# Patient Record
Sex: Male | Born: 1965 | Race: White | Hispanic: No | Marital: Married | Smoking: Former smoker
Health system: Southern US, Community
[De-identification: ages and names within clinical notes are randomized; demographics above are authoritative.]

## PROBLEM LIST (undated history)

## (undated) DIAGNOSIS — R001 Bradycardia, unspecified: Secondary | ICD-10-CM

## (undated) DIAGNOSIS — I2510 Atherosclerotic heart disease of native coronary artery without angina pectoris: Secondary | ICD-10-CM

## (undated) DIAGNOSIS — F32A Depression, unspecified: Secondary | ICD-10-CM

## (undated) DIAGNOSIS — G930 Cerebral cysts: Secondary | ICD-10-CM

## (undated) DIAGNOSIS — I252 Old myocardial infarction: Secondary | ICD-10-CM

## (undated) DIAGNOSIS — F329 Major depressive disorder, single episode, unspecified: Secondary | ICD-10-CM

## (undated) DIAGNOSIS — E663 Overweight: Secondary | ICD-10-CM

## (undated) DIAGNOSIS — E785 Hyperlipidemia, unspecified: Secondary | ICD-10-CM

## (undated) HISTORY — DX: Overweight: E66.3

## (undated) HISTORY — DX: Major depressive disorder, single episode, unspecified: F32.9

## (undated) HISTORY — PX: HERNIA REPAIR: SHX51

## (undated) HISTORY — DX: Hyperlipidemia, unspecified: E78.5

## (undated) HISTORY — PX: APPENDECTOMY: SHX54

## (undated) HISTORY — DX: Old myocardial infarction: I25.2

## (undated) HISTORY — DX: Atherosclerotic heart disease of native coronary artery without angina pectoris: I25.10

## (undated) HISTORY — DX: Bradycardia, unspecified: R00.1

## (undated) HISTORY — DX: Depression, unspecified: F32.A

## (undated) HISTORY — DX: Cerebral cysts: G93.0

---

## 2007-10-31 ENCOUNTER — Emergency Department (HOSPITAL_COMMUNITY): Admission: EM | Admit: 2007-10-31 | Discharge: 2007-11-01 | Payer: Self-pay | Admitting: Emergency Medicine

## 2009-01-17 ENCOUNTER — Emergency Department (HOSPITAL_COMMUNITY): Admission: EM | Admit: 2009-01-17 | Discharge: 2009-01-17 | Payer: Self-pay | Admitting: Emergency Medicine

## 2009-01-17 IMAGING — CR DG CHEST 2V
2 series · 2 of 2 positions shown · non-contrast
Comparison: None

CLINICAL DATA: High blood pressure, blurred vision, slight chest
pain

CHEST - 2 VIEW

[w chest pa]
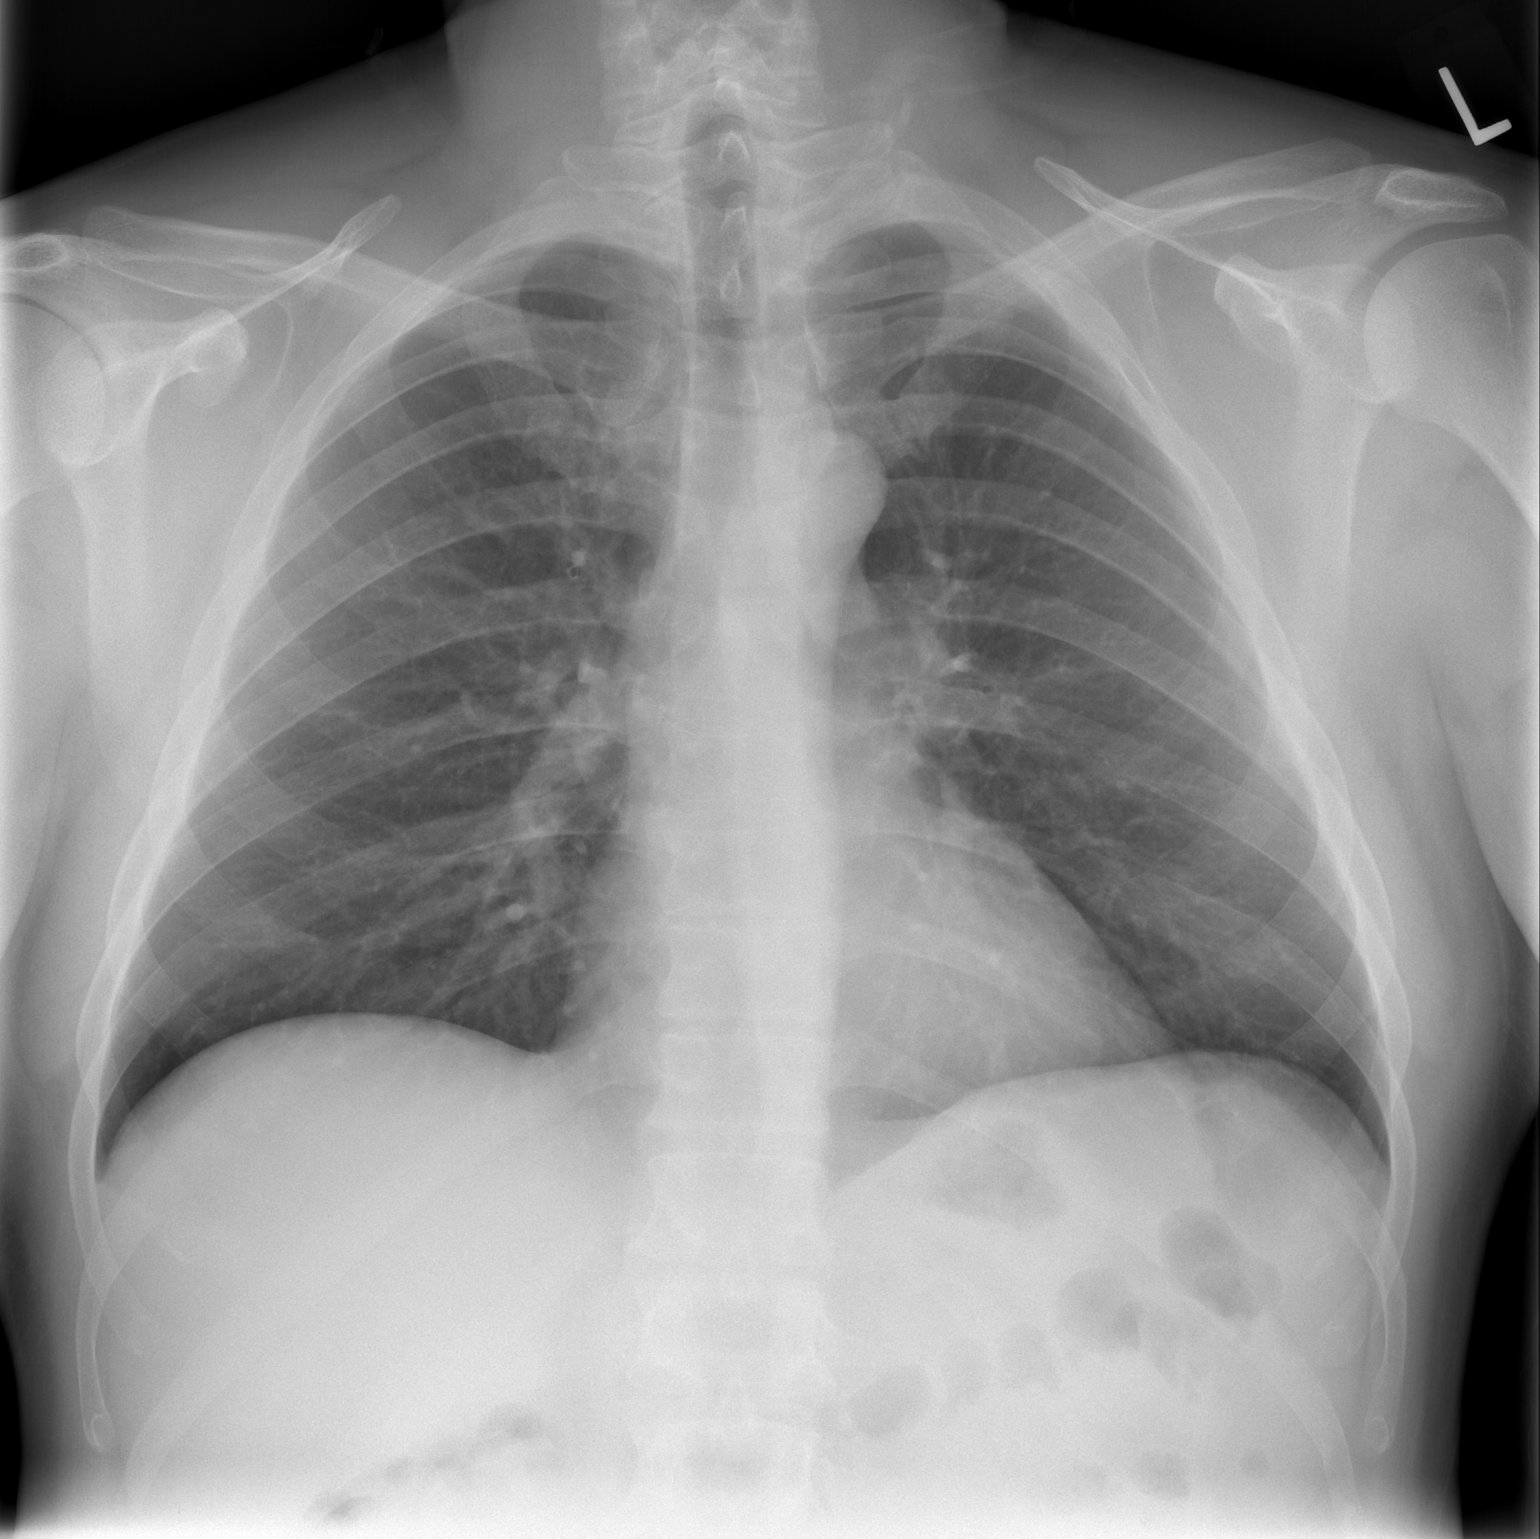

[w chest lat]
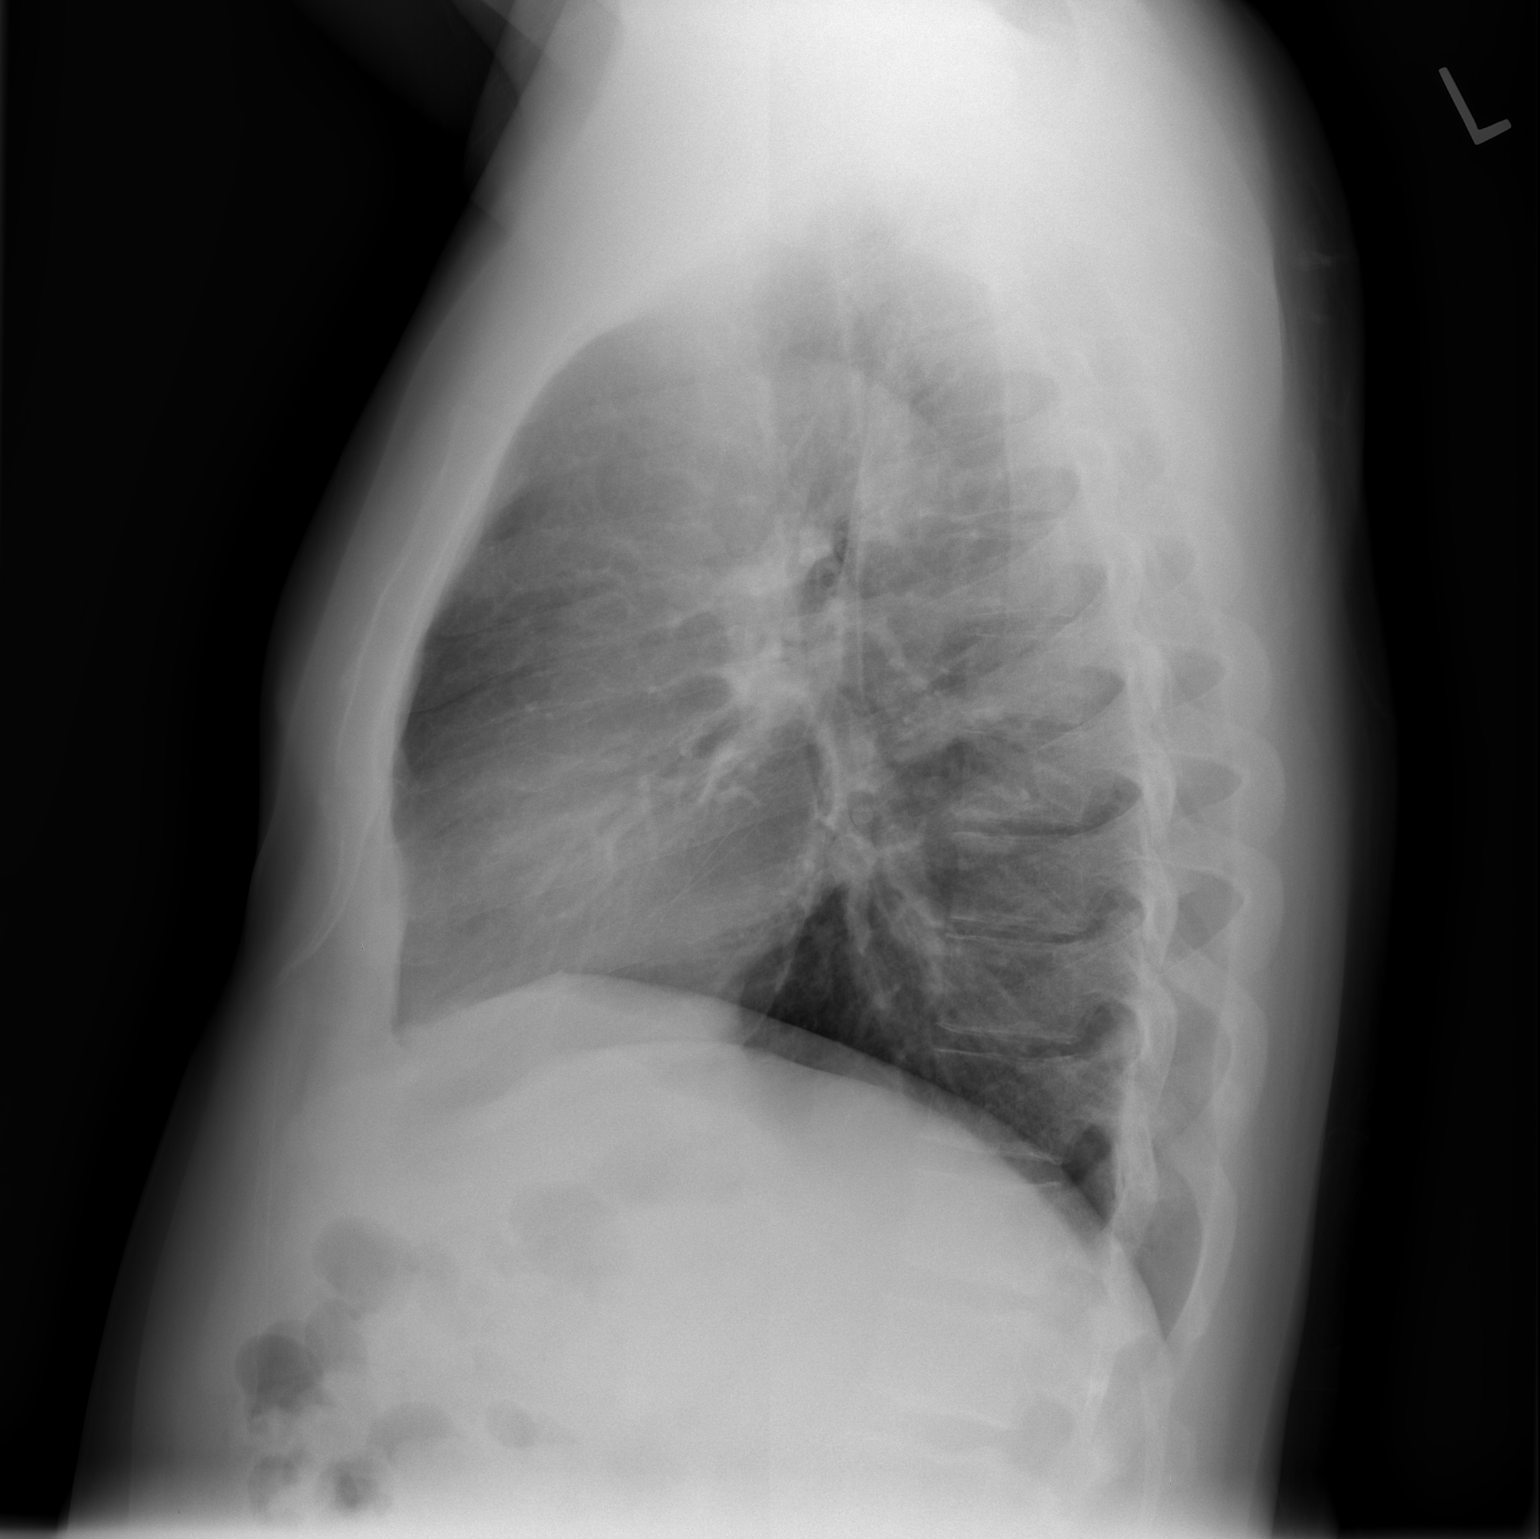

[2 of 2 positions shown; findings below may reference images not displayed]

FINDINGS: The lungs are clear.  The heart is within normal limits
in size.  No bony abnormality is seen.
IMPRESSION: No active lung disease.

## 2010-09-09 LAB — URINALYSIS, ROUTINE W REFLEX MICROSCOPIC
Glucose, UA: NEGATIVE mg/dL
Ketones, ur: NEGATIVE mg/dL
Nitrite: NEGATIVE
Protein, ur: NEGATIVE mg/dL
Urobilinogen, UA: 0.2 mg/dL (ref 0.0–1.0)

## 2010-09-09 LAB — BASIC METABOLIC PANEL
Chloride: 105 mEq/L (ref 96–112)
Glucose, Bld: 94 mg/dL (ref 70–99)
Potassium: 4.1 mEq/L (ref 3.5–5.1)
Sodium: 140 mEq/L (ref 135–145)

## 2010-09-09 LAB — CBC
HCT: 44.7 % (ref 39.0–52.0)
MCV: 91.2 fL (ref 78.0–100.0)
Platelets: 275 10*3/uL (ref 150–400)
RBC: 4.9 MIL/uL (ref 4.22–5.81)
WBC: 8.2 10*3/uL (ref 4.0–10.5)

## 2010-09-09 LAB — DIFFERENTIAL
Eosinophils Absolute: 0.1 10*3/uL (ref 0.0–0.7)
Eosinophils Relative: 1 % (ref 0–5)
Lymphs Abs: 1.9 10*3/uL (ref 0.7–4.0)
Monocytes Relative: 7 % (ref 3–12)

## 2010-09-09 LAB — RAPID URINE DRUG SCREEN, HOSP PERFORMED: Benzodiazepines: NOT DETECTED

## 2012-12-03 DIAGNOSIS — E559 Vitamin D deficiency, unspecified: Secondary | ICD-10-CM | POA: Insufficient documentation

## 2012-12-03 DIAGNOSIS — G930 Cerebral cysts: Secondary | ICD-10-CM | POA: Insufficient documentation

## 2014-06-14 DIAGNOSIS — I213 ST elevation (STEMI) myocardial infarction of unspecified site: Secondary | ICD-10-CM | POA: Insufficient documentation

## 2014-12-02 ENCOUNTER — Ambulatory Visit (INDEPENDENT_AMBULATORY_CARE_PROVIDER_SITE_OTHER): Payer: BLUE CROSS/BLUE SHIELD | Admitting: Orthopedic Surgery

## 2014-12-02 ENCOUNTER — Encounter: Payer: Self-pay | Admitting: Orthopedic Surgery

## 2014-12-02 DIAGNOSIS — M19041 Primary osteoarthritis, right hand: Secondary | ICD-10-CM | POA: Diagnosis not present

## 2014-12-02 MED ORDER — DICLOFENAC POTASSIUM 50 MG PO TABS: 50.0000 mg | ORAL_TABLET | Freq: Two times a day (BID) | ORAL | Status: DC

## 2014-12-02 NOTE — Progress Notes (Addendum)
New patient  Chief complaint bilateral hand pain bilateral knee pain bilateral hip pain  The patient to be evaluated for his right hand today paragraph he has x-rays already taken  He complains of severe pain in his right hand and wrist and severe pain the wrist is jammed in any way. He has difficulties at times with some activities he is employed as a Freight forwarderbody shop mechanic. He says the pain is severe as a dull ache   Review of Systems - Negative except sinus problems, anxiety seaonal allergy stiff joints   Exam Vital signs are stable. He is awake alert and oriented 3 Mood and affect are normal Gait and station are unaffected Appearance is normal well-groomed.  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 He has a history of rheumatoid arthritis but has not been worked up  He has not been splinted is not taking anti-inflammatory medications   At this point I would put him in his thumb splint a Rhino splint and then put him on diclofenac 50 mg twice a day #60 I'm aware that he is on Plavix  I think he needs his rheumatoid studies done his primary care doctor can do that   I am not a hand surgeon so if he doesn't respond to splinting and anti-inflammatory so we'll send him to a hand specialist  He will come back in 6 weeks to evaluate his right knee with an x-ray and an evaluation and examination at that time if he is not better from his wrist we can go ahead and make the referral for his hand

## 2014-12-04 ENCOUNTER — Encounter: Payer: Self-pay | Admitting: Orthopedic Surgery

## 2014-12-04 NOTE — Addendum Note (Signed)
Addended by: Vickki HearingHARRISON, STANLEY E on: 12/04/2014 10:25 AM   Modules accepted: Medications

## 2014-12-20 ENCOUNTER — Encounter: Payer: Self-pay | Admitting: Orthopedic Surgery

## 2014-12-20 ENCOUNTER — Ambulatory Visit: Payer: BLUE CROSS/BLUE SHIELD | Admitting: Orthopedic Surgery

## 2015-07-06 DIAGNOSIS — G5622 Lesion of ulnar nerve, left upper limb: Secondary | ICD-10-CM | POA: Insufficient documentation

## 2015-07-06 DIAGNOSIS — G5603 Carpal tunnel syndrome, bilateral upper limbs: Secondary | ICD-10-CM | POA: Insufficient documentation

## 2015-08-18 ENCOUNTER — Ambulatory Visit: Payer: BLUE CROSS/BLUE SHIELD | Admitting: Cardiology

## 2015-09-16 ENCOUNTER — Encounter: Payer: Self-pay | Admitting: Cardiology

## 2015-09-16 ENCOUNTER — Other Ambulatory Visit: Payer: Self-pay | Admitting: *Deleted

## 2015-09-16 ENCOUNTER — Ambulatory Visit (INDEPENDENT_AMBULATORY_CARE_PROVIDER_SITE_OTHER): Payer: BLUE CROSS/BLUE SHIELD | Admitting: Cardiology

## 2015-09-16 VITALS — BP 124/80 | HR 76 | Ht 74.0 in | Wt 227.6 lb

## 2015-09-16 DIAGNOSIS — Z136 Encounter for screening for cardiovascular disorders: Secondary | ICD-10-CM

## 2015-09-16 DIAGNOSIS — I25119 Atherosclerotic heart disease of native coronary artery with unspecified angina pectoris: Secondary | ICD-10-CM | POA: Diagnosis not present

## 2015-09-16 DIAGNOSIS — Z8659 Personal history of other mental and behavioral disorders: Secondary | ICD-10-CM

## 2015-09-16 DIAGNOSIS — R030 Elevated blood-pressure reading, without diagnosis of hypertension: Secondary | ICD-10-CM

## 2015-09-16 DIAGNOSIS — E785 Hyperlipidemia, unspecified: Secondary | ICD-10-CM

## 2015-09-16 DIAGNOSIS — IMO0001 Reserved for inherently not codable concepts without codable children: Secondary | ICD-10-CM

## 2015-09-16 MED ORDER — NITROGLYCERIN 0.4 MG SL SUBL: 0.4000 mg | SUBLINGUAL_TABLET | SUBLINGUAL | Status: DC | PRN

## 2015-09-16 NOTE — Patient Instructions (Signed)
Your physician wants you to follow-up in: 6 months with Dr. McDowell You will receive a reminder letter in the mail two months in advance. If you don't receive a letter, please call our office to schedule the follow-up appointment.  Your physician recommends that you continue on your current medications as directed. Please refer to the Current Medication list given to you today.  Thank you for choosing [facility]!!    

## 2015-09-16 NOTE — Progress Notes (Signed)
Cardiology Office Note  Date: 09/16/2015   ID: Travis Tapia, DOB 1965/07/26, MRN [id number]  PCP: Allie Dimmer, MD  Referring provider: Dr. Santo Held Consulting Cardiologist: Rozann Lesches, MD   Chief Complaint  Patient presents with  . Coronary Artery Disease    History of Present Illness: Travis Tapia is a 50 y.o. male referred for cardiology consultation by Dr. Barrie Dunker. I reviewed extensive records and updated his chart. He has been followed by Dr. Briant Sites with [facility]. History includes inferoposterior STEMI in January 2016 treated with DES to the RCA. He underwent staged DES intervention to the LAD at that time as well. LVEF was 55-60% by echocardiography.  He is here today with his wife. He states that due to change in job, now working in [place], he wanted to establish with a cardiologist that was closer by. We discussed his recent history. He states that earlier in the year he was having trouble with elevations in blood pressure, had been off of Toprol-XL, but after ER visit was placed on labetalol. He ran out of that medication and decided to go back on Toprol-XL. Today's blood pressure is normal. He states that during the time his blood pressure was up he was having some recurrent chest pain, but this has resolved. He also reports being on an antidepressant temporarily, admits that he still gets frustrated and irritated easily. He follows with his PCP for this.  Records indicate follow-up with Dr. Briant Sites in March of this year. At that time he was experiencing chest discomfort that was felt to be potentially GI in etiology. He is on Zantac.  I reviewed his ECG today which shows normal sinus rhythm. Medications also reviewed. He remains on aspirin and Plavix.  Past Medical History  Diagnosis Date  . Depression   . CAD (coronary artery disease)     DES to RCA 06/2014, DES to LAD 06/2014  . History of inferior wall myocardial infarction    January 2016  . Arachnoid cyst     Cerebellum - followed at [facility]    Past Surgical History  Procedure Laterality Date  . Appendectomy    . Hernia repair      Current Outpatient Prescriptions  Medication Sig Dispense Refill  . aspirin 81 MG tablet Take 81 mg by mouth daily.    Marland Kitchen atorvastatin (LIPITOR) 80 MG tablet Take 80 mg by mouth at bedtime.    . Cholecalciferol ([employer] D3) 2000 UNITS CAPS Take 1 capsule by mouth daily.     . clopidogrel (PLAVIX) 75 MG tablet Take 75 mg by mouth daily.    . metoprolol succinate (TOPROL-XL) 25 MG 24 hr tablet Take 25 mg by mouth daily.    . ranitidine (ZANTAC) 150 MG tablet Take 150 mg by mouth 2 (two) times daily.     No current facility-administered medications for this visit.   Allergies:  Review of patient's allergies indicates no known allergies.   Social History: The patient  reports that he quit smoking about 9 years ago. He does not have any smokeless tobacco history on file. He reports that he drinks about 1.2 oz of alcohol per week. He reports that he does not use illicit drugs.   Family History: The patient's family history includes Heart disease in his father; Hyperlipidemia in his father; Hypertension in his mother; Stroke in his father.   ROS:  Please see the history of present illness. Otherwise, complete review of systems is positive for stress  at work, emotional upset and frustration intermittently.  All other systems are reviewed and negative.   Physical Exam: VS:  BP 124/80 mmHg  Pulse 76  Ht 6' 2"  (1.88 m)  Wt 227 lb 9.6 oz (103.239 kg)  BMI 29.21 kg/m2  SpO2 96%, BMI Body mass index is 29.21 kg/(m^2).  Wt Readings from Last 3 Encounters:  09/16/15 227 lb 9.6 oz (103.239 kg)    General: Overweight male, appears comfortable at rest. HEENT: Conjunctiva and lids normal, oropharynx clear. Neck: Supple, no elevated JVP or carotid bruits, no thyromegaly. Lungs: Clear to auscultation, nonlabored breathing at rest. Cardiac:  Regular rate and rhythm, no S3 or significant systolic murmur, no pericardial rub. Abdomen: Soft, nontender, no hepatomegaly, bowel sounds present, no guarding or rebound. Extremities: No pitting edema, distal pulses 2+. Skin: Warm and dry. Musculoskeletal: No kyphosis. Neuropsychiatric: Alert and oriented x3, affect grossly appropriate.  ECG: I personally reviewed the prior tracing from 07/23/2014 which showed normal sinus rhythm with nonspecific T-wave changes.  Recent Labwork:  November 2016: Potassium 4.1, BUN 16, creatinine 0.9, hemoglobin 15.1, platelets 268, cholesterol 105, triglycerides 111, HDL 32, LDL 50, TSH 2.8  Other Studies Reviewed Today:  Cardiac catheterization 06/14/2014: Left Ventriculography:  EF 50-55% with subtle inferior hypokinesis.  Coronary Arteriography: Left Main Normal caliber vessel with no disease.  LAD Normal caliber transapical vessel with small first and normal second diagonal branches. There is a 70-80% early mid LAD stenosis. TIMI 1-2 flow.  Left Circumflex Normal caliber non-dominant vessel with one large multi-branching obtuse marginal. There is a 30% proximal LCx stenosis.  RCA Normal caliber dominant system with normal PDA and posterolateral branches. The proximal RCA was 100% occluded. TIMI 0 flow. This is the acute infarct vessel.   Coronary Intervention:  RCA: The pre-procedural stenosis was 100% and there was TIMI 0 flow which is the acute infarct vessel. A JR5 guide was used to engage the RCA. A Prowater wire was advanced into the distal RCA. A 2.5/12balloon was used to pre-dilate and a 2.75/15 Xience drug eluting stent was placed. Post-dilatation was done with a 3.25 [place]o balloon.   An IVUS catheter was then used to image the treated segment of the vessel and this revealed complete stent expansion, no malapposition or edge dissection, reference vessel diameters consistent the the stent and post-dilation balloon diameters. The minimum cross  sectional area following stent deployment was >47m2.  The final result was 0% and TIMI 3 flow.  IMPRESSION:  1. Successful one-vessel PCI with DES for acute MI 2. Findings discussed with patient PLAN:  1. Routine post-cath care 2. [facility]  RAnnie Main MD 06/16/2014 10:24 PM   Cardiac catheterization 06/15/2014: Coronary Intervention:  LAD: The pre-procedural stenosis was 80% and there was TIMI 3 flow. AL2 guide was used to engage the LM. A Prowater wire was advanced into the distal LAD. A 2.5/12 balloon was used to pre-dilate and a 3.0/15 Xience drug eluting stent was placed. Post-dilatation was not required.   An IVUS catheter was then used to image the treated segment of the vessel and this revealed complete stent expansion, no malapposition or edge dissection, reference vessel diameters consistent the the stent and post-dilation balloon diameters. The minimum cross sectional area following stent deployment was >478m.  The final result was 0% and TIMI 3 flow.  Details-The 37f88fu 3.5 Launcher guide was inserted. The [employer]0 guidewire was inserted. LCX The  Pt 2 Ms 185cm .014 guidewire was inserted. Into LAD The Stent Xience Alpine 3.0 X 15 stent delivery system was inserted over an interventional guidewire with stent intact. The Stent Xience Alpine 3.0 X 15 stent delivery system was removed with the stent still intact and undeployed. The Resolute 2.75x14 stent delivery system was inserted over an interventional guidewire with stent intact. The Resolute 2.75x14 stent delivery system was removed with the stent still intact and undeployed. The [employer]180 guidewire was inserted. The Emerge Rx Balloon 2.5 X 12 ([id number]) balloon was inserted.Over grandslam wire A Emerge Rx Balloon 2.5 X 12 ([id number]) was used to10 atm for 20 sec The Emerge Rx Balloon 2.5 X 12 ([id number]) balloon was removed over the  guidewire. The 27fGuideliner guide was inserted. The Stent Xience Alpine 3.0 X 15 stent delivery system was inserted over an interventional guidewire with stent intact. The Stent Xience Alpine 3.0 X 15 stent delivery system was removed with the stent still intact and undeployed. The 520fuideliner was removed. The 78f71fu 3.5 Launcher was removed. The 53f 41f1.0 Launcher guide was inserted. The current guiding catheter was exchanged for the 53fr 24flatz Left Al2 guiding catheter. The Pt 2 Ms 185cm .014 guidewire was inserted. LCX The Asahi Prowater 180 guidewire was inserted. LAD The 53fr G80feliner V2 guide was inserted. The Emerge Rx Balloon 2.5 X 12 ([id number] was inserted. A Emerge Rx Balloon 2.5 X 12 ([id number] to12 atm for 20 sec The Emerge Rx Balloon 2.5 X 12 ([id number] was removed over the guidewire. The Stent Xience Alpine 3.0 X 15 stent delivery system was inserted over an interventional guidewire with stent intact. A Stent Xience Alpine 3.0 X 15 was used to12 atm for 30 sec The Stent Xience Alpine 3.0 X 15 stent delivery system was removed. The Ivus Catheter [employer] Platinum IVUS catheter was inserted over the interventional guidewire. IVUS catheter position verified under flouro. Recording IVUS images. IVUS catheter removed over guidewire. The 53fr Gu58fliner V2 was removed. The 53fr Amp57fz Left Al2 was removed.   IMPRESSION:  1. Successful one-vessel PCI with DES 2. Findings discussed with patient PLAN:  1. Routine post-cath care 2. [facility]  Travis Daniel Nones1/05/2015 12:01 PM   Echocardiogram 06/15/2014: Summary 1. Overall left ventricular ejection fraction is estimated at 55 to 60%. 2. (Grade 1) Mildly abnormal left ventricular diastolic filling. 3. Mild concentric left ventricular hypertrophy.  Left Ventricle: Overall left ventricular ejection fraction is estimated at 55 to 60%. The left  ventricular internal cavity size was normal. LV septal wall thickness was moderately increased. LV posterior wall thickness is mildly increased. Mild concentric left ventricular hypertrophy. Spectral Doppler shows Grade 1 (Mild) pattern of LV diastolic filling. Tissue Doppler indicates normal left ventricular end diastolic pressure..  Right Ventricle: Normal right ventricular size, wall thickness, and systolic function.  Left Atrium: The left atrium is normal in size.  Right Atrium: The right atrium is normal in size. The inferior vena cava measures 1.40 cm.  Pericardium: There is no evidence of pericardial effusion.  Mitral Valve: Structurally normal mitral valve, with normal leaflet excursion; without any evidence of mitral stenosis. Trace mitral valve regurgitation is seen.  Tricuspid Valve: The tricuspid valve is normal in structure. Trace tricuspid regurgitation is visualized.  Aortic Valve: The aortic valve is trileaflet and structurally normal, with normal leaflet excursion; without any evidence of aortic stenosis. The  peak pressure gradient across the aortic valve is 4.2 mmHg, and the mean pressure gradient is 3.0 mmHg. The calculated AVA is 3.18 cm. No evidence of aortic valve regurgitation is seen.  Pulmonic Valve: The pulmonic valve is normal. No indication of pulmonic valve regurgitation.  Aorta: The aortic root and ascending aorta appear structurally normal, with no evidence of dilitation.  Pulmonary Artery: The main pulmonary artery appears normal in size, origin and position.  Shunts: No evidence of intracardiac shunt.  2D AND M-MODE MEASUREMENTS (normal ranges within parentheses) Left Ventricle: IVSd-2D (0.7-1.1cm): 1.32 cm LVPWd-2D (0.7-1.1cm): 1.21 cm LVIDd-2D (3.4-5.7cm): 4.62 cm LVIDs-2D (1.9-3.7cm): 3.64 cm LV FS-2D (>25%): 21.2 %  Aorta/Left Atrium: Aortic Root-2D (2.4-3.7cm): 3.6 cm Left Atrium-2D (1.9-4.0cm): 3.7 cm LVOT: 2.3 cm LA Volume  (MOD) LA Vol A4C MOD 37.6 ml LA Vol A2C MOD 46.9 ml LA Vol BP MOD 41.7 ml TAPSE 6.06 cm  LV DIASTOLIC FUNCTION: MV Peak E: 0.77 m/s MV Peak A: 0.80 m/s E/A Ratio: 0.95 LV IVRT: 85 msec septal e': 7.51 m/s E/e' Ratio: 0.10  Mitral Valve: MV Max Vel: 0.83 m/s MV Mean Grad: 1.0 mmHg MV P1/2 Time: 32.19 msec MV Area, PHT: 6.83 cm  Aortic Valve: AoV Max Vel: 1.02 m/s AoV Peak PG: 4.2 mmHg AoV Mean PG: 3.0 mmHg  LVOT Vmax: 0.84 m/s LVOT VTI: 0.157 m LVOT Diameter: 2.30 cm  AoV Area, Vmax: 3.40 cm AoV Area, VTI: 3.18 cm AoV Area, Vmn: 3.22 cm AoV Area Index (VTI): 1.46  Tricuspid Valve and PA/RV Systolic Pressure: TR Max Velocity: 2.42 m/s RA Pressure: RVSP/PASP:  Pulmonic Valve: PV Max Velocity: 0.56 m/s PV Max PG: 1.2 mmHg PV Mean PG:  Electronically signed by Tillie Fantasia II, Signature Date/Time: 06/15/2014/8:36:07 PM  Assessment and Plan:  1. CAD status post inferoposterior STEMI in January 2016 treated with DES to the RCA followed by staged DES intervention to the LAD. LVEF normal range at that time. At present he does not report any angina symptoms and his ECG is normal. I reviewed his medications, he would like to stay on DAPT for now. I have recommended a walking regimen for exercise and more attention to stress reduction. He will stay on Toprol-XL for now, although lead me know if he experiences any fatigue or other symptoms. We will see him back in 6 months, sooner if needed. Refill provided for sublingual nitroglycerin.  2. Elevated blood pressure earlier in the year, no standing history of hypertension. Continue observation for now. Consider ARB if additional therapy as needed.  3. Continues on statin therapy with LDL 50 and November 2016. Does have mildly low HDL.  4. Symptoms of frustration, intermittent emotional upset. Atypical depression could certainly be an issue. I recommended that he continue to discuss the symptoms with Dr.  Jenean Lindau.  Current medicines were reviewed with the patient today.   Orders Placed This Encounter  Procedures  . EKG 12-Lead    Disposition: FU with me in 6 months.   Signed, Satira Sark, MD, [facility] 09/16/2015 2:35 PM    [facility] [facility] [place], [place], [place] [address] Phone: [phone]; Fax: [phone]

## 2015-12-28 ENCOUNTER — Telehealth: Payer: Self-pay | Admitting: Cardiology

## 2015-12-28 NOTE — Telephone Encounter (Signed)
Travis Tapia walked into office wanting to check with Dr. Diona Browner about taking Garcinia Cambogia for weight loss and also start taking Apple Cider Vinegar.  Please advise and call home #.

## 2015-12-28 NOTE — Telephone Encounter (Signed)
These would not be treatments that I would generally recommend to my patients as the data are fairly inconsistent about whether there is a definitive benefit and also because we do not fully understand what other potential interactions they may have with other prescribed medications. He should do his research carefully when deciding about using these types of supplements.

## 2015-12-29 NOTE — Telephone Encounter (Signed)
Patient informed. 

## 2016-01-20 ENCOUNTER — Emergency Department (HOSPITAL_COMMUNITY): Payer: BLUE CROSS/BLUE SHIELD

## 2016-01-20 ENCOUNTER — Observation Stay (HOSPITAL_COMMUNITY)
Admission: EM | Admit: 2016-01-20 | Discharge: 2016-01-21 | Disposition: A | Discharge status: Home / Self Care | Payer: BLUE CROSS/BLUE SHIELD | Attending: Internal Medicine | Admitting: Internal Medicine

## 2016-01-20 ENCOUNTER — Encounter (HOSPITAL_COMMUNITY): Payer: Self-pay | Admitting: Emergency Medicine

## 2016-01-20 DIAGNOSIS — R739 Hyperglycemia, unspecified: Secondary | ICD-10-CM | POA: Diagnosis not present

## 2016-01-20 DIAGNOSIS — R0789 Other chest pain: Principal | ICD-10-CM | POA: Insufficient documentation

## 2016-01-20 DIAGNOSIS — E876 Hypokalemia: Secondary | ICD-10-CM | POA: Insufficient documentation

## 2016-01-20 DIAGNOSIS — I2510 Atherosclerotic heart disease of native coronary artery without angina pectoris: Secondary | ICD-10-CM | POA: Diagnosis not present

## 2016-01-20 DIAGNOSIS — R079 Chest pain, unspecified: Secondary | ICD-10-CM | POA: Diagnosis present

## 2016-01-20 DIAGNOSIS — I252 Old myocardial infarction: Secondary | ICD-10-CM | POA: Diagnosis not present

## 2016-01-20 DIAGNOSIS — Z79899 Other long term (current) drug therapy: Secondary | ICD-10-CM | POA: Diagnosis not present

## 2016-01-20 DIAGNOSIS — E785 Hyperlipidemia, unspecified: Secondary | ICD-10-CM

## 2016-01-20 DIAGNOSIS — Z955 Presence of coronary angioplasty implant and graft: Secondary | ICD-10-CM | POA: Insufficient documentation

## 2016-01-20 DIAGNOSIS — Z7902 Long term (current) use of antithrombotics/antiplatelets: Secondary | ICD-10-CM | POA: Insufficient documentation

## 2016-01-20 DIAGNOSIS — Z720 Tobacco use: Secondary | ICD-10-CM | POA: Insufficient documentation

## 2016-01-20 DIAGNOSIS — I2583 Coronary atherosclerosis due to lipid rich plaque: Secondary | ICD-10-CM

## 2016-01-20 LAB — CBC
HCT: 42.2 % (ref 39.0–52.0)
Hemoglobin: 14.7 g/dL (ref 13.0–17.0)
MCH: 30.9 pg (ref 26.0–34.0)
MCHC: 34.8 g/dL (ref 30.0–36.0)
MCV: 88.7 fL (ref 78.0–100.0)
PLATELETS: 265 10*3/uL (ref 150–400)
RBC: 4.76 MIL/uL (ref 4.22–5.81)
RDW: 12.3 % (ref 11.5–15.5)
WBC: 6.7 10*3/uL (ref 4.0–10.5)

## 2016-01-20 LAB — BASIC METABOLIC PANEL
Anion gap: 8 (ref 5–15)
BUN: 12 mg/dL (ref 6–20)
CALCIUM: 9 mg/dL (ref 8.9–10.3)
CO2: 23 mmol/L (ref 22–32)
CREATININE: 0.9 mg/dL (ref 0.61–1.24)
Chloride: 107 mmol/L (ref 101–111)
GFR calc non Af Amer: >60 mL/min (ref >60)
Glucose, Bld: 139 mg/dL — ABNORMAL HIGH (ref 65–99)
Potassium: 3.4 mmol/L — ABNORMAL LOW (ref 3.5–5.1)
SODIUM: 138 mmol/L (ref 135–145)

## 2016-01-20 LAB — TROPONIN I

## 2016-01-20 LAB — I-STAT TROPONIN, ED: TROPONIN I, POC: 0 ng/mL (ref 0.00–0.08)

## 2016-01-20 LAB — MRSA PCR SCREENING: MRSA by PCR: NEGATIVE

## 2016-01-20 MED ORDER — SODIUM CHLORIDE 0.9% FLUSH
3.0000 mL | Freq: Two times a day (BID) | INTRAVENOUS | Status: DC
  Administered 2016-01-20 – 2016-01-21 (×2): 3 mL via INTRAVENOUS

## 2016-01-20 MED ORDER — NITROGLYCERIN 0.4 MG SL SUBL: 0.4000 mg | SUBLINGUAL_TABLET | SUBLINGUAL | Status: DC | PRN

## 2016-01-20 MED ORDER — CLOPIDOGREL BISULFATE 75 MG PO TABS
75.0000 mg | ORAL_TABLET | Freq: Every morning | ORAL | Status: DC
  Administered 2016-01-21: 75 mg via ORAL
  Filled 2016-01-20: qty 1

## 2016-01-20 MED ORDER — VITAMIN D3 25 MCG (1000 UNIT) PO TABS
2000.0000 [IU] | ORAL_TABLET | Freq: Every day | ORAL | Status: DC
  Administered 2016-01-20 – 2016-01-21 (×2): 2000 [IU] via ORAL
  Filled 2016-01-20 (×3): qty 2

## 2016-01-20 MED ORDER — ASPIRIN EC 81 MG PO TBEC
81.0000 mg | DELAYED_RELEASE_TABLET | Freq: Every day | ORAL | Status: DC
  Administered 2016-01-21: 81 mg via ORAL
  Filled 2016-01-20: qty 1

## 2016-01-20 MED ORDER — FAMOTIDINE 20 MG PO TABS
10.0000 mg | ORAL_TABLET | Freq: Two times a day (BID) | ORAL | Status: DC
Start: 2016-01-20 — End: 2016-01-21
  Administered 2016-01-20 – 2016-01-21 (×2): 10 mg via ORAL
  Filled 2016-01-20 (×2): qty 1

## 2016-01-20 MED ORDER — MORPHINE SULFATE (PF) 2 MG/ML IV SOLN: 1.0000 mg | INTRAVENOUS | Status: DC | PRN

## 2016-01-20 MED ORDER — ENOXAPARIN SODIUM 40 MG/0.4ML ~~LOC~~ SOLN
40.0000 mg | SUBCUTANEOUS | Status: DC
  Administered 2016-01-20: 40 mg via SUBCUTANEOUS
  Filled 2016-01-20: qty 0.4

## 2016-01-20 MED ORDER — ATORVASTATIN CALCIUM 80 MG PO TABS
80.0000 mg | ORAL_TABLET | Freq: Every day | ORAL | Status: DC
  Administered 2016-01-20: 80 mg via ORAL
  Filled 2016-01-20: qty 1

## 2016-01-20 MED ORDER — SODIUM CHLORIDE 0.9 % IV SOLN
INTRAVENOUS | Status: AC
  Administered 2016-01-20: 23:00:00 via INTRAVENOUS

## 2016-01-20 MED ORDER — POTASSIUM CHLORIDE CRYS ER 20 MEQ PO TBCR
20.0000 meq | EXTENDED_RELEASE_TABLET | Freq: Once | ORAL | Status: AC
  Administered 2016-01-20: 20 meq via ORAL
  Filled 2016-01-20: qty 1

## 2016-01-20 NOTE — ED Triage Notes (Addendum)
Pt reports was at work today when chest pain developed this morning. Pt reports history of same with x2 stent placement in 2016. Pt reports takes daily aspirin and is on plavix. Pt alert and oriented. Pt denies sob,nausea. Mild sweating noted at time of EKG. Pt reports generalized weakness and "coolness" to BLE for last several days.pt reports took x1 nitro PTA, pt reports minimal relief.

## 2016-01-20 NOTE — ED Notes (Signed)
Patient states his chest pain is 3/10. Patient states he does not want anything for pain, but will let nurse know if pain increases and wants something for pain.

## 2016-01-20 NOTE — ED Provider Notes (Signed)
[facility]-EM[facility]ENCY DEPT Provider Note   CSN: [id number] Arrival date & time: 01/20/16  1357     History   Chief Complaint Chief Complaint  Patient presents with  . Chest Pain    HPI Travis Tapia is a 50 y.o. male.  50 yo male with CAD/ stent on plavix and compliant presents with chest pain since this am.  Feels similar to MI hx in 2015. Mild neck radiation.  No vomiting or diaphoresis.  No recent exertional sxs.  No PE risk factors.  Mild ache currently.    The history is provided by the patient and medical records.  Chest Pain   Pertinent negatives include no abdominal pain, no back pain, no fever, no headaches, no shortness of breath and no vomiting.    Past Medical History:  Diagnosis Date  . Arachnoid cyst    Cerebellum - followed at [facility]  . CAD (coronary artery disease)    DES to RCA 06/2014, DES to LAD 06/2014  . Depression   . History of inferior wall myocardial infarction    January 2016    There are no active problems to display for this patient.   Past Surgical History:  Procedure Laterality Date  . APPENDECTOMY    . HERNIA REPAIR         Home Medications    Prior to Admission medications   Medication Sig Start Date End Date Taking? Authorizing Provider  aspirin 81 MG tablet Take 81 mg by mouth daily.    Historical Provider, MD  atorvastatin (LIPITOR) 80 MG tablet Take 80 mg by mouth at bedtime.    Historical Provider, MD  Cholecalciferol ([employer]S D3) 2000 UNITS CAPS Take 1 capsule by mouth daily.     Historical Provider, MD  clopidogrel (PLAVIX) 75 MG tablet Take 75 mg by mouth daily.    Historical Provider, MD  metoprolol succinate (TOPROL-XL) 25 MG 24 hr tablet Take 25 mg by mouth daily.    Historical Provider, MD  nitroGLYCERIN (NITROSTAT) 0.4 MG SL tablet Place 1 tablet (0.4 mg total) under the tongue every 5 (five) minutes as needed for chest pain. 09/16/15   Jonelle Sidle, MD  ranitidine (ZANTAC) 150 MG tablet Take 150 mg by mouth 2 (two) times  daily.    Historical Provider, MD    Family History Family History  Problem Relation Age of Onset  . Heart disease Father   . Stroke Father   . Hyperlipidemia Father   . Hypertension Mother     Social History Social History  Substance Use Topics  . Smoking status: Former Smoker    Quit date: 06/04/2006  . Smokeless tobacco: Current User    Types: Chew  . Alcohol use 1.2 oz/week    2 Cans of beer per week     Comment: beer twice a week.     Allergies   Review of patient's allergies indicates no known allergies.   Review of Systems Review of Systems  Constitutional: Negative for chills and fever.  HENT: Negative for congestion.   Eyes: Negative for visual disturbance.  Respiratory: Negative for shortness of breath.   Cardiovascular: Positive for chest pain.  Gastrointestinal: Negative for abdominal pain and vomiting.  Genitourinary: Negative for dysuria and flank pain.  Musculoskeletal: Negative for back pain, neck pain and neck stiffness.  Skin: Negative for rash.  Neurological: Negative for light-headedness and headaches.     Physical Exam Updated Vital Signs BP 124/87 (BP Location: Left Arm)   Pulse  63   Resp 14   Ht 5\' 8"  (1.727 m)   Wt 227 lb (103 kg)   SpO2 95%   BMI 34.52 kg/m   Physical Exam  Constitutional: He is oriented to person, place, and time. He appears well-developed and well-nourished.  HENT:  Head: Normocephalic and atraumatic.  Eyes: Conjunctivae are normal. Right eye exhibits no discharge. Left eye exhibits no discharge.  Neck: Normal range of motion. Neck supple. No tracheal deviation present.  Cardiovascular: Normal rate, regular rhythm and intact distal pulses.   Pulmonary/Chest: Effort normal and breath sounds normal.  Abdominal: Soft. He exhibits no distension. There is no tenderness. There is no guarding.  Musculoskeletal: He exhibits no edema.  Neurological: He is alert and oriented to person, place, and time.  Skin: Skin is  warm. No rash noted.  Psychiatric: He has a normal mood and affect.  Nursing note and vitals reviewed.    ED Treatments / Results  Labs (all labs ordered are listed, but only abnormal results are displayed) Labs Reviewed  BASIC METABOLIC PANEL - Abnormal; Notable for the following:       Result Value   Potassium 3.4 (*)    Glucose, Bld 139 (*)    All other components within normal limits  CBC  I-STAT TROPOININ, ED    EKG  EKG Interpretation  Date/Time:  Friday January 20 2016 14:05:00 EDT Ventricular Rate:  76 PR Interval:    QRS Duration: 103 QT Interval:  387 QTC Calculation: 436 R Axis:   70 Text Interpretation:  Sinus rhythm T wave inversion Confirmed by Jodi MourningZAVITZ MD, Kyriaki Moder [phone]([id number]) on 01/20/2016 2:49:48 PM       Radiology Dg Chest 2 View  Result Date: 01/20/2016 CLINICAL DATA:  Chest pain today.  Nausea.  Bilateral leg weakness. EXAM: CHEST  2 VIEW COMPARISON:  One-view chest x-ray 07/05/2015 FINDINGS: The heart size is normal. The lungs are clear. The visualized soft tissues and bony thorax are unremarkable. IMPRESSION: Negative two view chest. Electronically Signed   By: Marin Robertshristopher  Mattern M.D.   On: 01/20/2016 15:18    Procedures Procedures (including critical care time)  Medications Ordered in ED Medications - No data to display   Initial Impression / Assessment and Plan / ED Course  I have reviewed the triage vital signs and the nursing notes.  Pertinent labs & imaging results that were available during my care of the patient were reviewed by me and considered in my medical decision making (see chart for details).  Clinical Course   Pt with CAD hx presents with chest pain similar to previous MI.  Pain improved in ED, asa prior to arrival.   Troponin neg.   Plan for tele obs for stress test/ further eval.  Transfer to [facility] for stress test, [facility]. The patients results and plan were reviewed and discussed.   Any x-rays  performed were independently reviewed by myself.   Differential diagnosis were considered with the presenting HPI.  Medications - No data to display  Vitals:   01/20/16 1407 01/20/16 1408 01/20/16 1518  BP: 125/93  124/87  Pulse: 77  63  Resp: 20  14  SpO2: 96%  95%  Weight:  227 lb (103 kg)   Height:  5\' 8"  (1.727 m)     Final diagnoses:  Acute chest pain    Admission/ observation were discussed with the admitting physician, patient and/or family and they are comfortable with the plan.  Final Clinical Impressions(s) / ED Diagnoses   Final diagnoses:  Acute chest pain    New Prescriptions New Prescriptions   No medications on file     Blane OharaJoshua Rhaelyn Giron, MD 01/20/16 1547

## 2016-01-20 NOTE — ED Notes (Signed)
Patient refused Morphine. States he does not want anything for pain.

## 2016-01-20 NOTE — H&P (Signed)
[facility] H&P   Patient Demographics:    Travis AdesReuben Tapia, is a 50 y.o. male  MRN: [id number]   DOB - [birth year]  Admit Date - 01/20/2016  Outpatient Primary MD for the patient is Majel HomerBUCHANAN,LINDA, MD  Referring MD/NP/PA: Blane OharaJoshua Zavitz  Outpatient Specialists:  Dr. Diona BrownerMcDowell (cardiologist)  Patient coming from:  work  Chief Complaint  Patient presents with  . Chest Pain      HPI:    Travis Tapia  is a 50 y.o. male, w/ history of CAD s/p stent x2 c/o chest pain starting about 10:30 am this am. While at work.  "pressure" with radiation to the neck. Pt was walking when occurred. Pt denies fever, chills, palp, dyspnea, heartburn.  Pt tried some gingerale but this didn't help so presented to ED. Lasted until presented to ED.  Took slg nitro at 1:30pm which didn't help.  Pt states that pain similiary to his prior heart pain.  Pain level currently 1/10.    In Ed,  NSR @ 75, nl axis, poor R progression, no st-t changes c/w acute ischemia.  CXR =>negative.  [facility] no [facility] services over the weekend at [facility]     Review of systems:    In addition to the HPI above,  No Fever-chills, No Headache, No changes with Vision or hearing, No problems swallowing food or Liquids, No Chest pain, Cough or Shortness of Breath, No Abdominal pain, No Nausea or Vommitting, Bowel movements are regular, No Blood in stool or Urine, No dysuria, No new skin rashes or bruises, No new joints pains-aches,  No new weakness, tingling, numbness in any extremity, No recent weight gain or loss, No polyuria, polydypsia or polyphagia, No significant Mental Stressors.  A full 10 point Review of Systems was done, except as stated above, all other Review of Systems were negative.   With Past History of the following :    Past Medical History:  Diagnosis Date  .  Arachnoid cyst    [facility]  . CAD (coronary artery disease)    DES to RCA 06/2014, DES to LAD 06/2014  . Depression   . History of inferior wall myocardial infarction    January 2016      Past Surgical History:  Procedure Laterality Date  . APPENDECTOMY    . HERNIA REPAIR        Social History:     Social History  Substance Use Topics  . Smoking status: Former Smoker    Quit date: 06/04/2006  . Smokeless tobacco: Current User    Types: Chew  . Alcohol use 1.2 oz/week    2 Cans of beer per week     Comment: beer twice a week.     Lives -  At home  Mobility -      Family History :  Family History  Problem Relation Age of Onset  . Heart disease Father     CABG  . Stroke Father   . Hyperlipidemia Father   . Hypertension Mother       Home Medications:   Prior to Admission medications   Medication Sig Start Date End Date Taking? Authorizing Provider  aspirin 81 MG tablet Take 81 mg by mouth daily.   Yes Historical Provider, MD  atorvastatin (LIPITOR) 80 MG tablet Take 80 mg by mouth at bedtime.   Yes Historical Provider, MD  Cholecalciferol ([employer]S D3) 2000 UNITS CAPS Take 1 capsule by mouth daily.    Yes Historical Provider, MD  clopidogrel (PLAVIX) 75 MG tablet Take 75 mg by mouth every morning.    Yes Historical Provider, MD  nitroGLYCERIN (NITROSTAT) 0.4 MG SL tablet Place 1 tablet (0.4 mg total) under the tongue every 5 (five) minutes as needed for chest pain. 09/16/15  Yes Jonelle SidleSamuel G McDowell, MD  ranitidine (ZANTAC) 150 MG tablet Take 150 mg by mouth 2 (two) times daily.   Yes Historical Provider, MD  SALINE NASAL MIST NA Place 1-2 sprays into the nose daily as needed (for congestion).   Yes Historical Provider, MD     Allergies:    No Known Allergies   Physical Exam:   Vitals  Blood pressure 124/96, pulse 81, resp. rate 18, height 5\' 8"  (1.727 m), weight 103 kg (227 lb), SpO2 97 %.   1. General  lying in bed in NAD,    2.  Normal affect and insight, Not Suicidal or Homicidal, Awake Alert, Oriented X 3.  3. No F.N deficits, ALL C.Nerves Intact, Strength 5/5 all 4 extremities, Sensation intact all 4 extremities, Plantars down going.  4. Ears and Eyes appear Normal, Conjunctivae clear, PERRLA. Moist Oral Mucosa.  5. Supple Neck, No JVD, No cervical lymphadenopathy appriciated, No Carotid Bruits.  6. Symmetrical Chest wall movement, Good air movement bilaterally, CTAB.  7. RRR, No Gallops, Rubs or Murmurs, No Parasternal Heave.  8. Positive Bowel Sounds, Abdomen Soft, No tenderness, No organomegaly appriciated,No rebound -guarding or rigidity.  9.  No Cyanosis, Normal Skin Turgor, No Skin Rash or Bruise.  10. Good muscle tone,  joints appear normal , no effusions, Normal ROM.  11. No Palpable Lymph Nodes in Neck or Axillae     Data Review:    CBC  Recent Labs Lab 01/20/16 1355  WBC 6.7  HGB 14.7  HCT 42.2  PLT 265  MCV 88.7  MCH 30.9  MCHC 34.8  RDW 12.3   ------------------------------------------------------------------------------------------------------------------  Chemistries   Recent Labs Lab 01/20/16 1355  NA 138  K 3.4*  CL 107  CO2 23  GLUCOSE 139*  BUN 12  CREATININE 0.90  CALCIUM 9.0   ------------------------------------------------------------------------------------------------------------------ estimated creatinine clearance is 116.7 mL/min (by C-G formula based on SCr of 0.9 mg/dL). ------------------------------------------------------------------------------------------------------------------ No results for input(s): TSH, T4TOTAL, T3FREE, THYROIDAB in the last 72 hours.  Invalid input(s): FREET3  Coagulation profile No results for input(s): INR, PROTIME in the last 168 hours. ------------------------------------------------------------------------------------------------------------------- No results for input(s): DDIMER in the last 72  hours. -------------------------------------------------------------------------------------------------------------------  Cardiac Enzymes No results for input(s): CKMB, TROPONINI, MYOGLOBIN in the last 168 hours.  Invalid input(s): CK ------------------------------------------------------------------------------------------------------------------ No results found for: BNP   ---------------------------------------------------------------------------------------------------------------  Urinalysis    Component Value Date/Time   COLORURINE YELLOW 01/17/2009 1752   APPEARANCEUR CLOUDY (A) 01/17/2009 1752   LABSPEC 1.014 01/17/2009 1752   PHURINE 7.0 01/17/2009 1752  GLUCOSEU NEGATIVE 01/17/2009 1752   HGBUR NEGATIVE 01/17/2009 1752   BILIRUBINUR NEGATIVE 01/17/2009 1752   KETONESUR NEGATIVE 01/17/2009 1752   PROTEINUR NEGATIVE 01/17/2009 1752   UROBILINOGEN 0.2 01/17/2009 1752   NITRITE NEGATIVE 01/17/2009 1752   LEUKOCYTESUR  01/17/2009 1752    NEGATIVE MICROSCOPIC NOT DONE ON URINES WITH NEGATIVE PROTEIN, BLOOD, LEUKOCYTES, NITRITE, OR GLUCOSE <1000 mg/dL.    ----------------------------------------------------------------------------------------------------------------   Imaging Results:    Dg Chest 2 View  Result Date: 01/20/2016 CLINICAL DATA:  Chest pain today.  Nausea.  Bilateral leg weakness. EXAM: CHEST  2 VIEW COMPARISON:  One-view chest x-ray 07/05/2015 FINDINGS: The heart size is normal. The lungs are clear. The visualized soft tissues and bony thorax are unremarkable. IMPRESSION: Negative two view chest. Electronically Signed   By: Marin Roberts M.D.   On: 01/20/2016 15:18      Assessment & Plan:    Principal Problem:   Chest pain Active Problems:   Hypokalemia   Hyperglycemia    1. Chest pain Tele Trop I q6hx 3 Check lipid NPO after midnite Nuclear stress test  2. Hypokalemia Repleted Check cmp in am  3. Hyperglycemia Check  hga1c    DVT Prophylaxis Lovenox - SCDs  AM Labs Ordered, also please review Full Orders  Family Communication: Admission, patients condition and plan of care including tests being ordered have been discussed with the patient  who indicate understanding and agree with the plan and Code Status.  Code Status Full code  Likely DC to   Condition GUARDED   Consults called:   Admission status: observation  Time spent in minutes : 45 minutes   Pearson Grippe M.D on 01/20/2016 at 4:41 PM  Between 7am to 7pm - Pager - [phone] After 7pm go to [email or web address] - password [facility]  [facility] - Office  [phone]

## 2016-01-20 NOTE — ED Notes (Signed)
EDP at bedside  

## 2016-01-21 ENCOUNTER — Observation Stay (HOSPITAL_COMMUNITY): Payer: BLUE CROSS/BLUE SHIELD

## 2016-01-21 ENCOUNTER — Observation Stay (HOSPITAL_BASED_OUTPATIENT_CLINIC_OR_DEPARTMENT_OTHER): Payer: BLUE CROSS/BLUE SHIELD

## 2016-01-21 DIAGNOSIS — I2510 Atherosclerotic heart disease of native coronary artery without angina pectoris: Secondary | ICD-10-CM | POA: Diagnosis not present

## 2016-01-21 DIAGNOSIS — R079 Chest pain, unspecified: Secondary | ICD-10-CM | POA: Diagnosis not present

## 2016-01-21 DIAGNOSIS — E876 Hypokalemia: Secondary | ICD-10-CM | POA: Diagnosis not present

## 2016-01-21 DIAGNOSIS — E785 Hyperlipidemia, unspecified: Secondary | ICD-10-CM | POA: Diagnosis not present

## 2016-01-21 DIAGNOSIS — R739 Hyperglycemia, unspecified: Secondary | ICD-10-CM

## 2016-01-21 DIAGNOSIS — I2583 Coronary atherosclerosis due to lipid rich plaque: Secondary | ICD-10-CM

## 2016-01-21 LAB — CBC
HEMATOCRIT: 41.9 % (ref 39.0–52.0)
HEMOGLOBIN: 14.4 g/dL (ref 13.0–17.0)
MCH: 31 pg (ref 26.0–34.0)
MCHC: 34.4 g/dL (ref 30.0–36.0)
MCV: 90.1 fL (ref 78.0–100.0)
Platelets: 217 10*3/uL (ref 150–400)
RBC: 4.65 MIL/uL (ref 4.22–5.81)
RDW: 12.3 % (ref 11.5–15.5)
WBC: 6.5 10*3/uL (ref 4.0–10.5)

## 2016-01-21 LAB — COMPREHENSIVE METABOLIC PANEL
ALBUMIN: 3.7 g/dL (ref 3.5–5.0)
ALT: 37 U/L (ref 17–63)
ANION GAP: 6 (ref 5–15)
AST: 28 U/L (ref 15–41)
Alkaline Phosphatase: 63 U/L (ref 38–126)
BUN: 9 mg/dL (ref 6–20)
CHLORIDE: 109 mmol/L (ref 101–111)
CO2: 25 mmol/L (ref 22–32)
Calcium: 9 mg/dL (ref 8.9–10.3)
Creatinine, Ser: 0.73 mg/dL (ref 0.61–1.24)
GFR calc Af Amer: >60 mL/min (ref >60)
Glucose, Bld: 101 mg/dL — ABNORMAL HIGH (ref 65–99)
POTASSIUM: 3.3 mmol/L — AB (ref 3.5–5.1)
Sodium: 140 mmol/L (ref 135–145)
Total Bilirubin: 0.8 mg/dL (ref 0.3–1.2)
Total Protein: 6.3 g/dL — ABNORMAL LOW (ref 6.5–8.1)

## 2016-01-21 LAB — NM MYOCAR MULTI W/SPECT W/WALL MOTION / EF
CHL CUP MPHR: 172 {beats}/min
CHL CUP RESTING HR STRESS: 85 {beats}/min
CSEPHR: 91 %
Estimated workload: 11.5 METS
Exercise duration (min): 9 min
Exercise duration (sec): 52 s
Peak HR: 157 {beats}/min

## 2016-01-21 LAB — TROPONIN I
Troponin I: <0.03 ng/mL (ref <0.03)
Troponin I: <0.03 ng/mL (ref <0.03)

## 2016-01-21 MED ORDER — TECHNETIUM TC 99M TETROFOSMIN IV KIT
10.0000 | PACK | Freq: Once | INTRAVENOUS | Status: AC | PRN
  Administered 2016-01-21: 10 via INTRAVENOUS

## 2016-01-21 MED ORDER — POTASSIUM CHLORIDE CRYS ER 20 MEQ PO TBCR
40.0000 meq | EXTENDED_RELEASE_TABLET | Freq: Once | ORAL | Status: AC
  Administered 2016-01-21: 40 meq via ORAL

## 2016-01-21 MED ORDER — TECHNETIUM TC 99M TETROFOSMIN IV KIT
30.0000 | PACK | Freq: Once | INTRAVENOUS | Status: AC | PRN
  Administered 2016-01-21: 30 via INTRAVENOUS

## 2016-01-21 NOTE — Consult Note (Addendum)
Admit date: 01/20/2016 Referring Physician  Dr. Isidoro Donningai Primary Physician  Dr. Majel HomerLinda Buchanan Primary Cardiologist  Dr. Simona HuhSam McDowell Reason for Consultation  Chest pain   HPI: This is a 50 y.o. male, w/ history of CAD s/p PCI with DES to the RCA and LAD 06/2014 who had been in his USOH who c/o chest pain starting about 10:30 yesterday am.   He was at work and developed "pressure" with radiation to the neck. Pt was walking when occurred. He denied any associated SOB, nausea, diaphoresis.  Pt tried some gingerale but this didn't help so presented to ED. The pain lasted until he presented to ED.  Took slg nitro at 1:30pm which didn't help.  Pt states that pain similiar to his prior anginal heart pain.  In ER EKG showed  NSR @ 75, nl axis, poor R progression, no st-t changes c/w acute ischemia.  CXR =>negative.  [facility] and now Cardiology is asked to consult for further evaluation of chest pain.  He denies any further episodes this am.      PMH:   Past Medical History:  Diagnosis Date  . Arachnoid cyst    [facility]  . CAD (coronary artery disease)    DES to RCA 06/2014, DES to LAD 06/2014  . Depression   . History of inferior wall myocardial infarction    January 2016     PSH:   Past Surgical History:  Procedure Laterality Date  . APPENDECTOMY    . HERNIA REPAIR      Allergies:  Review of patient's allergies indicates no known allergies. Prior to Admit Meds:   Prescriptions Prior to Admission  Medication Sig Dispense Refill Last Dose  . aspirin 81 MG tablet Take 81 mg by mouth daily.   01/20/2016 at morning  . atorvastatin (LIPITOR) 80 MG tablet Take 80 mg by mouth at bedtime.   01/19/2016 at Unknown time  . Cholecalciferol (CVS D3) 2000 UNITS CAPS Take 1 capsule by mouth daily.    01/19/2016 at Unknown time  . clopidogrel (PLAVIX) 75 MG tablet Take 75 mg by mouth every morning.    01/20/2016 at Unknown time  . nitroGLYCERIN (NITROSTAT) 0.4 MG SL tablet Place 1  tablet (0.4 mg total) under the tongue every 5 (five) minutes as needed for chest pain. 25 tablet 3 01/20/2016 at 1330  . ranitidine (ZANTAC) 150 MG tablet Take 150 mg by mouth 2 (two) times daily.   01/20/2016 at morning  . SALINE NASAL MIST NA Place 1-2 sprays into the nose daily as needed (for congestion).   Past Week at Unknown time   Fam HX:    Family History  Problem Relation Age of Onset  . Heart disease Father     CABG  . Stroke Father   . Hyperlipidemia Father   . Hypertension Mother    Social HX:    Social History   Social History  . Marital status: Married    Spouse name: N/A  . Number of children: N/A  . Years of education: N/A   Occupational History  . Not on file.   Social History Main Topics  . Smoking status: Former Smoker    Quit date: 06/04/2006  . Smokeless tobacco: Current User    Types: Chew  . Alcohol use 1.2 oz/week    2 Cans of beer per week     Comment: beer twice a week.  . Drug use: No  . Sexual  activity: Not on file   Other Topics Concern  . Not on file   Social History Narrative  . No narrative on file     ROS:  All 11 ROS were addressed and are negative except what is stated in the HPI  Physical Exam: Blood pressure 112/87, pulse (!) 59, temperature 98.1 F (36.7 C), temperature source Oral, resp. rate 17, height 6\' 2"  (1.88 m), weight 220 lb 6.4 oz (100 kg), SpO2 100 %.    General: Well developed, well nourished, in no acute distress Head: Eyes PERRLA, No xanthomas.   Normal cephalic and atramatic  Lungs:   Clear bilaterally to auscultation and percussion. Heart:   HRRR S1 S2 Pulses are 2+ & equal. Abdomen: Bowel sounds are positive, abdomen soft and non-tender without masses  Msk:  Back normal, normal gait. Normal strength and tone for age. Extremities:   No clubbing, cyanosis or edema.  DP +1 Neuro: Alert and oriented X 3. Psych:  Good affect, responds appropriately    Labs:   Lab Results  Component Value Date   WBC 6.5  01/21/2016   HGB 14.4 01/21/2016   HCT 41.9 01/21/2016   MCV 90.1 01/21/2016   PLT 217 01/21/2016    Recent Labs Lab 01/21/16 0401  NA 140  K 3.3*  CL 109  CO2 25  BUN 9  CREATININE 0.73  CALCIUM 9.0  PROT 6.3*  BILITOT 0.8  ALKPHOS 63  ALT 37  AST 28  GLUCOSE 101*   No results found for: PTT No results found for: INR, PROTIME Lab Results  Component Value Date   TROPONINI <0.03 01/21/2016    No results found for: CHOL No results found for: HDL No results found for: LDLCALC No results found for: TRIG No results found for: CHOLHDL No results found for: LDLDIRECT    Radiology:  Dg Chest 2 View  Result Date: 01/20/2016 CLINICAL DATA:  Chest pain today.  Nausea.  Bilateral leg weakness. EXAM: CHEST  2 VIEW COMPARISON:  One-view chest x-ray 07/05/2015 FINDINGS: The heart size is normal. The lungs are clear. The visualized soft tissues and bony thorax are unremarkable. IMPRESSION: Negative two view chest. Electronically Signed   By: Marin Robertshristopher  Mattern M.D.   On: 01/20/2016 15:18    EKG:  NSR with no ST changes except T wave inversion in V2  ASSESSMENT/PLAN:   1.  Prolonged exertional CP eventually resolved with SL NTG.  Trop neg x 3 and EKG with no acute ischemic changes. He is NPO so will proceed with nuclear stress test today to rule out ischemia. He is a year out from PCI of RCA and LAD so need to consider restenosis vs. New lesion. Continue ASA/Plavix and statin.  Intermittent bradycardia precludes addition of BB.  2.  ASCAD s/p remote MI with  PCI with DES to RCA and LAD in 2016.  Continue ASA/statin/Plavix.  3.  Dyslipidemia - continue high dose statin.   4.  Hypokalemia - replete  Armanda Magicraci Leslye Puccini, MD  01/21/2016  9:56 AM

## 2016-01-21 NOTE — Progress Notes (Signed)
The patient was seen in nuclear medicine for a treadmill myoview. He tolerated the procedure well. No acute ST or TW changes on ECG.   Suzzette RighterErin Smith, NP  [facility]

## 2016-01-21 NOTE — Progress Notes (Signed)
[facility]                                                                              Patient Demographics  Travis Tapia, is a 50 y.o. male, DOB - 12-Oct-1965, NWG:[id number]  Admit date - 01/20/2016   Admitting Physician Pearson Grippe, MD  Outpatient Primary MD for the patient is Majel Homer, MD  Outpatient specialists:   LOS - 0  days    Chief Complaint  Patient presents with  . Chest Pain       Brief summary  Travis Tapia  is a 50 y.o. male, w/ history of CAD s/p stent x2 c/o chest pain starting about 10:30 am this am. While at work.  "pressure" with radiation to the neck. Pt was walking when occurred. Pt denies fever, chills, palp, dyspnea, heartburn.  Pt tried some gingerale but this didn't help so presented to ED. Lasted until presented to ED.  Took slg nitro at 1:30pm which didn't help.  Pt states that pain similiary to his prior heart pain.  Pain level currently 1/10.   Patient was admitted for chest pain workup    Assessment & Plan    Principal Problem:   Chest pain With underlying history of coronary artery disease, PCI in 2016 to RCA and LAD - Currently chest pain resolved, serial troponins negative, EKG showed no acute ischemic changes - Cardiology was consulted, recommended nuclear medicine stress test - Continue aspirin, Plavix, statin - Patient has intermittent bradycardia and hence no beta blocker at this time per cardiology  Active Problems:   Hypokalemia - Replaced    Hyperglycemia - Follow hemoglobin A1c    Hyperlipidemia with target LDL less than 70 Continue statin  Code Status: fc  DVT Prophylaxis:  Lovenox  Family Communication: Discussed in detail with the patient, all imaging results, lab results explained to the patient and wife    Disposition Plan:    Time Spent in minutes 25 minutes  Procedures:  NM stress test  Consultants:   Cardiology  Antimicrobials:      Medications  Scheduled Meds: . aspirin EC   81 mg Oral Daily  . atorvastatin  80 mg Oral QHS  . cholecalciferol  2,000 Units Oral Daily  . clopidogrel  75 mg Oral q morning - 10a  . enoxaparin (LOVENOX) injection  40 mg Subcutaneous Q24H  . famotidine  10 mg Oral BID  . sodium chloride flush  3 mL Intravenous Q12H   Continuous Infusions:  PRN Meds:.nitroGLYCERIN   Antibiotics   Anti-infectives    None        Subjective:   Travis Tapia was seen and examined today. Patient denies dizziness, shortness of breath, abdominal pain, N/V/D/C, new weakness, numbess, tingling. No acute events overnight.  No chest pain at the time of my examination  Objective:   Vitals:   01/21/16 1035 01/21/16 1039 01/21/16 1040 01/21/16 1141  BP: (!) 191/116 (!) 152/89 (!) 150/88 (!) 139/91  Pulse:    71  Resp:    16  Temp:    97.8 F (36.6 C)  TempSrc:    Oral  SpO2:  100%  Weight:      Height:        Intake/Output Summary (Last 24 hours) at 01/21/16 1246 Last data filed at 01/21/16 0900  Gross per 24 hour  Intake           1217.5 ml  Output              825 ml  Net            392.5 ml     Wt Readings from Last 3 Encounters:  01/21/16 100 kg (220 lb 6.4 oz)  09/16/15 103.2 kg (227 lb 9.6 oz)     Exam  General: Alert and oriented x 3, NAD  HEENT:  PERRLA, EOMI, Anicteric Sclera, mucous membranes moist.   Neck: Supple, no JVD, no masses  Cardiovascular: S1 S2 auscultated, no rubs, murmurs or gallops. Regular rate and rhythm.  Respiratory: Clear to auscultation bilaterally, no wheezing, rales or rhonchi  Gastrointestinal: Soft, nontender, nondistended, + bowel sounds  Ext: no cyanosis clubbing or edema  Neuro: AAOx3, Cr N's II- XII. Strength 5/5 upper and lower extremities bilaterally  Skin: No rashes  Psych: Normal affect and demeanor, alert and oriented x3    Data Reviewed:  I have personally reviewed following labs and imaging studies  Micro Results Recent Results (from the past 240 hour(s))  MRSA PCR  Screening     Status: None   Collection Time: 01/20/16  9:36 PM  Result Value Ref Range Status   MRSA by PCR NEGATIVE NEGATIVE Final    Comment:        The GeneXpert MRSA Assay (FDA approved for NASAL specimens only), is one component of a comprehensive MRSA colonization surveillance program. It is not intended to diagnose MRSA infection nor to guide or monitor treatment for MRSA infections.     Radiology Reports Dg Chest 2 View  Result Date: 01/20/2016 CLINICAL DATA:  Chest pain today.  Nausea.  Bilateral leg weakness. EXAM: CHEST  2 VIEW COMPARISON:  One-view chest x-ray 07/05/2015 FINDINGS: The heart size is normal. The lungs are clear. The visualized soft tissues and bony thorax are unremarkable. IMPRESSION: Negative two view chest. Electronically Signed   By: Marin Robertshristopher  Mattern M.D.   On: 01/20/2016 15:18    Lab Data:  CBC:  Recent Labs Lab 01/20/16 1355 01/21/16 0401  WBC 6.7 6.5  HGB 14.7 14.4  HCT 42.2 41.9  MCV 88.7 90.1  PLT 265 217   Basic Metabolic Panel:  Recent Labs Lab 01/20/16 1355 01/21/16 0401  NA 138 140  K 3.4* 3.3*  CL 107 109  CO2 23 25  GLUCOSE 139* 101*  BUN 12 9  CREATININE 0.90 0.73  CALCIUM 9.0 9.0   GFR: Estimated Creatinine Clearance: 142.6 mL/min (by C-G formula based on SCr of 0.8 mg/dL). Liver Function Tests:  Recent Labs Lab 01/21/16 0401  AST 28  ALT 37  ALKPHOS 63  BILITOT 0.8  PROT 6.3*  ALBUMIN 3.7   No results for input(s): LIPASE, AMYLASE in the last 168 hours. No results for input(s): AMMONIA in the last 168 hours. Coagulation Profile: No results for input(s): INR, PROTIME in the last 168 hours. Cardiac Enzymes:  Recent Labs Lab 01/20/16 2145 01/21/16 0401  TROPONINI <0.03 <0.03   BNP (last 3 results) No results for input(s): PROBNP in the last 8760 hours. HbA1C: No results for input(s): HGBA1C in the last 72 hours. CBG: No results for input(s): GLUCAP in the last 168 hours. Lipid  Profile: No results for input(s): CHOL, HDL, LDLCALC, TRIG, CHOLHDL, LDLDIRECT in the last 72 hours. Thyroid Function Tests: No results for input(s): TSH, T4TOTAL, FREET4, T3FREE, THYROIDAB in the last 72 hours. Anemia Panel: No results for input(s): VITAMINB12, FOLATE, FERRITIN, TIBC, IRON, RETICCTPCT in the last 72 hours. Urine analysis:    Component Value Date/Time   COLORURINE YELLOW 01/17/2009 1752   APPEARANCEUR CLOUDY (A) 01/17/2009 1752   LABSPEC 1.014 01/17/2009 1752   PHURINE 7.0 01/17/2009 1752   GLUCOSEU NEGATIVE 01/17/2009 1752   HGBUR NEGATIVE 01/17/2009 1752   BILIRUBINUR NEGATIVE 01/17/2009 1752   KETONESUR NEGATIVE 01/17/2009 1752   PROTEINUR NEGATIVE 01/17/2009 1752   UROBILINOGEN 0.2 01/17/2009 1752   NITRITE NEGATIVE 01/17/2009 1752   LEUKOCYTESUR  01/17/2009 1752    NEGATIVE MICROSCOPIC NOT DONE ON URINES WITH NEGATIVE PROTEIN, BLOOD, LEUKOCYTES, NITRITE, OR GLUCOSE <1000 mg/dL.     Ladashia Demarinis M.D. [facility] 01/21/2016, 12:46 PM  Pager: [phone] Between 7am to 7pm - call Pager - [phone]  After 7pm go to [email or web address] - password [email or web address]  Call night coverage person covering after 7pm

## 2016-01-21 NOTE — Progress Notes (Signed)
Discharge teaching and instructions reviewed. VSS. Pt has no further questions. Discharging home via wife.

## 2016-01-21 NOTE — Discharge Summary (Signed)
Physician Discharge Summary   Patient ID: Travis Tapia MRN: [id number] DOB/AGE: [birth year] 50 y.o.  Admit date: 01/20/2016 Discharge date: 01/21/2016  Primary Care Physician:  Majel HomerBUCHANAN,LINDA, MD  Discharge Diagnoses:    . Chest pain Coronary artery disease with PCI 2016 Hypokalemia Hyperglycemia Hyperlipidemia    Consults:  Cardiology   Recommendations for Outpatient Follow-up:  1. Please follow hemoglobin A1c  2. Please repeat CBC/BMET at next visit   DIET: heart healthy diet  Allergies:  No Known Allergies   DISCHARGE MEDICATIONS: Current Discharge Medication List    CONTINUE these medications which have NOT CHANGED   Details  aspirin 81 MG tablet Take 81 mg by mouth daily.    atorvastatin (LIPITOR) 80 MG tablet Take 80 mg by mouth at bedtime.    Cholecalciferol ([employer] D3) 2000 UNITS CAPS Take 1 capsule by mouth daily.     clopidogrel (PLAVIX) 75 MG tablet Take 75 mg by mouth every morning.     nitroGLYCERIN (NITROSTAT) 0.4 MG SL tablet Place 1 tablet (0.4 mg total) under the tongue every 5 (five) minutes as needed for chest pain. Qty: 25 tablet, Refills: 3    ranitidine (ZANTAC) 150 MG tablet Take 150 mg by mouth 2 (two) times daily.    SALINE NASAL MIST NA Place 1-2 sprays into the nose daily as needed (for congestion).         Brief H and P: For complete details please refer to admission H and P, but in brief ReubenNanceis a 50 y.o.male,w/ history of CAD s/p stent x2 c/o chest pain starting about 10:30 am this am. While at work. "pressure" with radiation to the neck. Pt was walking when occurred. Pt denies fever, chills, palp, dyspnea, heartburn. Pt tried some gingerale but this didn't help so presented to ED. Lasted until presented to ED. Took slg nitro at 1:30pm which didn't help. Pt states that pain similiary to his prior heart pain. Pain level currently 1/10.  Patient was admitted for chest pain workup  Hospital Course:   Chest pain  With underlying history of coronary artery disease, PCI in 2016 to RCA and LAD - Currently chest pain resolved, serial troponins negative, EKG showed no acute ischemic changes - Cardiology was consulted, recommended nuclear medicine stress test - Continue aspirin, Plavix, statin - Patient has intermittent bradycardia and hence no beta blocker at this time per cardiology Nuclear medicine stress test showed EF 51%, no significant inducible ischemia with exercise stress.    Hypokalemia - Replaced    Hyperglycemia -Follow hemoglobin A1c outpatient    Hyperlipidemia with target LDL less than 70 Continue statin   Day of Discharge BP (!) 139/91 (BP Location: Right Arm)   Pulse 71   Temp 97.8 F (36.6 C) (Oral)   Resp 16   Ht 6\' 2"  (1.88 m)   Wt 100 kg (220 lb 6.4 oz)   SpO2 100%   BMI 28.30 kg/m   Physical Exam: General: Alert and awake oriented x3 not in any acute distress. HEENT: anicteric sclera, pupils reactive to light and accommodation CVS: S1-S2 clear no murmur rubs or gallops Chest: clear to auscultation bilaterally, no wheezing rales or rhonchi Abdomen: soft nontender, nondistended, normal bowel sounds Extremities: no cyanosis, clubbing or edema noted bilaterally Neuro: Cranial nerves II-XII intact, no focal neurological deficits   The results of significant diagnostics from this hospitalization (including imaging, microbiology, ancillary and laboratory) are listed below for reference.    LAB RESULTS: Basic Metabolic Panel:  Recent Labs  Lab 01/20/16 1355 01/21/16 0401  NA 138 140  K 3.4* 3.3*  CL 107 109  CO2 23 25  GLUCOSE 139* 101*  BUN 12 9  CREATININE 0.90 0.73  CALCIUM 9.0 9.0   Liver Function Tests:  Recent Labs Lab 01/21/16 0401  AST 28  ALT 37  ALKPHOS 63  BILITOT 0.8  PROT 6.3*  ALBUMIN 3.7   No results for input(s): LIPASE, AMYLASE in the last 168 hours. No results for input(s): AMMONIA in the last 168 hours. CBC:  Recent  Labs Lab 01/20/16 1355 01/21/16 0401  WBC 6.7 6.5  HGB 14.7 14.4  HCT 42.2 41.9  MCV 88.7 90.1  PLT 265 217   Cardiac Enzymes:  Recent Labs Lab 01/21/16 0401 01/21/16 1126  TROPONINI <0.03 <0.03   BNP: Invalid input(s): POCBNP CBG: No results for input(s): GLUCAP in the last 168 hours.  Significant Diagnostic Studies:  Dg Chest 2 View  Result Date: 01/20/2016 CLINICAL DATA:  Chest pain today.  Nausea.  Bilateral leg weakness. EXAM: CHEST  2 VIEW COMPARISON:  One-view chest x-ray 07/05/2015 FINDINGS: The heart size is normal. The lungs are clear. The visualized soft tissues and bony thorax are unremarkable. IMPRESSION: Negative two view chest. Electronically Signed   By: Marin Robertshristopher  Mattern M.D.   On: 01/20/2016 15:18   Nuclear medicine stress test  IMPRESSION: 1. Inferior attenuation artifact. No significant inducible ischemia with exercise stress.  2. Normal left ventricular wall motion.  3. Left ventricular ejection fraction 51%  4. Non invasive risk stratification*: Low     Disposition and Follow-up:    DISPOSITION: home    DISCHARGE FOLLOW-UP Follow-up Information    BUCHANAN,LINDA, MD. Schedule an appointment as soon as possible for a visit in 2 week(s).   Specialty:  Internal Medicine Contact information: [address] [place] [id number] [phone]            Time spent on Discharge: 25mins   Signed:   Jahquan Klugh M.D. [facility] 01/21/2016, 2:14 PM Pager: [phone]

## 2016-01-24 LAB — HEMOGLOBIN A1C
Hgb A1c MFr Bld: 5.4 % (ref 4.8–5.6)
Mean Plasma Glucose: 108 mg/dL

## 2016-01-24 NOTE — Progress Notes (Signed)
Cardiology Office Note  Date: 01/25/2016   ID: Travis Tapia, DOB [birth year], MRN [id number]  PCP: Allie Dimmer, MD  Primary Cardiologist: Rozann Lesches, MD   Chief Complaint  Patient presents with  . Coronary Artery Disease    History of Present Illness: Travis Tapia is a 50 y.o. male last seen in April. Record review finds recent hospitalization at [facility] (just discharged August 19) with chest discomfort. He ruled out for ACS by troponin I levels and underwent a Myoview study. This study showed inferior attenuation with no inducible ischemia and LVEF 51%, overall low risk. Medical therapy was recommended.  He presents today reporting no further chest pain. He mainly focuses on trouble with his overall mood, states that he loses his temper easily, also anxiety and possible panic attacks. He has not seen a behavioral health specialist, I did ask him to talk with his PCP Dr. Jenean Lindau after I met him at last visit.  I reviewed his cardiac medications. He is no longer taking Toprol-XL. States that he felt that it "did not help." We talked about use as an antianginal.  Past Medical History:  Diagnosis Date  . Arachnoid cyst    Cerebellum - followed at [facility]  . CAD (coronary artery disease)    DES to RCA 06/2014, DES to LAD 06/2014  . Depression   . 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 APPENDECTOMY    . HERNIA REPAIR      Current Outpatient Prescriptions  Medication Sig Dispense Refill  . aspirin 81 MG tablet Take 81 mg by mouth daily.    Marland Kitchen atorvastatin (LIPITOR) 80 MG tablet Take 80 mg by mouth at bedtime.    . Cholecalciferol ([employer]S D3) 2000 UNITS CAPS Take 1 capsule by mouth daily.     . clopidogrel (PLAVIX) 75 MG tablet Take 75 mg by mouth every morning.     . nitroGLYCERIN (NITROSTAT) 0.4 MG SL tablet Place 1 tablet (0.4 mg total) under the tongue every 5 (five) minutes as needed  for chest pain. 25 tablet 3  . ranitidine (ZANTAC) 150 MG tablet Take 150 mg by mouth 2 (two) times daily.    Marland Kitchen SALINE NASAL MIST NA Place 1-2 sprays into the nose daily as needed (for congestion).    Marland Kitchen atenolol (TENORMIN) 25 MG tablet Take 0.5 tablets (12.5 mg total) by mouth daily. 30 tablet 6   No current facility-administered medications for this visit.    Allergies:  Review of patient's allergies indicates no known allergies.   Social History: The patient  reports that he quit smoking about 9 years ago. His smokeless tobacco use includes Chew. He reports that he drinks about 1.2 oz of alcohol per week . He reports that he does not use drugs.   ROS:  Please see the history of present illness. Otherwise, complete review of systems is positive for occasional lightheadedness.  All other systems are reviewed and negative.   Physical Exam: VS:  BP 124/78   Pulse 90   Ht 6' 2"  (1.88 m)   Wt 223 lb (101.2 kg)   SpO2 95%   BMI 28.63 kg/m , BMI Body mass index is 28.63 kg/m.  Wt Readings from Last 3 Encounters:  01/25/16 223 lb (101.2 kg)  01/21/16 220 lb 6.4 oz (100 kg)  09/16/15 227 lb 9.6 oz (103.2 kg)    General: Overweight male,  appears comfortable at rest. HEENT: Conjunctiva and lids normal, oropharynx clear. Neck: Supple, no elevated JVP or carotid bruits, no thyromegaly. Lungs: Clear to auscultation, nonlabored breathing at rest. Cardiac: Regular rate and rhythm, no S3 or significant systolic murmur, no pericardial rub. Abdomen: Soft, nontender, bowel sounds present, no guarding or rebound. Extremities: No pitting edema, distal pulses 2+. Skin: Warm and dry. Musculoskeletal: No kyphosis. Neuropsychiatric: Alert and oriented x3, affect grossly appropriate.  ECG: I personally reviewed the tracing from 07/23/2014 which showed normal sinus rhythm with nonspecific T-wave changes.  Recent Labwork: 02-14-2016: ALT 37; AST 28; BUN 9; Creatinine, Ser 0.73; Hemoglobin 14.4; Platelets  217; Potassium 3.3; Sodium 140   Other Studies Reviewed Today:  Myoview 02/14/2016: IMPRESSION: 1. Inferior attenuation artifact. No significant inducible ischemia with exercise stress.  2. Normal left ventricular wall motion.  3. Left ventricular ejection fraction 51%  4. Non invasive risk stratification*: Low  Chest x-ray 01/20/2016: FINDINGS: The heart size is normal. The lungs are clear. The visualized soft tissues and bony thorax are unremarkable.  IMPRESSION: Negative two view chest.  Assessment and Plan:  1. CAD status post DES to the RCA and LAD back in January of this year by prior cardiologist in [place]. He had a recent follow-up Myoview that was overall low risk, and ruled out for ACS by cardiac enzymes. Plan is to continue medical therapy and observation. We are starting atenolol 12.5 mg daily as antianginal.  2. Patient being referred for behavioral health evaluation, complains of anxiety, easily losing temper, panic attacks. Has reported history of depression.  3. Hyperlipidemia, LDL was 50 by last check on Lipitor.  4. Elevated blood pressure. Continue with medical therapy. Recommended walking regimen for exercise. Also gave him information on maintenance exercise program.  Current medicines were reviewed with the patient today.   Orders Placed This Encounter  Procedures  . Ambulatory referral to Psychiatry    Disposition: Follow-up with me in 6 months.  Signed, Satira Sark, MD, [facility] 01/25/2016 2:29 PM    [place] [facility] [address] [place], [place] [address] Phone: [phone]; Fax: [phone]

## 2016-01-25 ENCOUNTER — Encounter: Payer: Self-pay | Admitting: Cardiology

## 2016-01-25 ENCOUNTER — Ambulatory Visit (INDEPENDENT_AMBULATORY_CARE_PROVIDER_SITE_OTHER): Payer: BLUE CROSS/BLUE SHIELD | Admitting: Cardiology

## 2016-01-25 VITALS — BP 124/78 | HR 90 | Ht 74.0 in | Wt 223.0 lb

## 2016-01-25 DIAGNOSIS — I25119 Atherosclerotic heart disease of native coronary artery with unspecified angina pectoris: Secondary | ICD-10-CM

## 2016-01-25 DIAGNOSIS — F419 Anxiety disorder, unspecified: Secondary | ICD-10-CM

## 2016-01-25 DIAGNOSIS — F418 Other specified anxiety disorders: Secondary | ICD-10-CM

## 2016-01-25 DIAGNOSIS — R030 Elevated blood-pressure reading, without diagnosis of hypertension: Secondary | ICD-10-CM

## 2016-01-25 DIAGNOSIS — IMO0001 Reserved for inherently not codable concepts without codable children: Secondary | ICD-10-CM

## 2016-01-25 DIAGNOSIS — F329 Major depressive disorder, single episode, unspecified: Secondary | ICD-10-CM

## 2016-01-25 DIAGNOSIS — E785 Hyperlipidemia, unspecified: Secondary | ICD-10-CM | POA: Diagnosis not present

## 2016-01-25 MED ORDER — ATENOLOL 25 MG PO TABS: 12.5000 mg | ORAL_TABLET | Freq: Every day | ORAL | 6 refills | Status: DC

## 2016-01-25 NOTE — Patient Instructions (Signed)
Medication Instructions:  Start atenolol 12.5 mg daily (  1/2 tablet daily )   Labwork: none  Testing/Procedures: none  Follow-Up: Your physician wants you to follow-up in: 6 months .  You will receive a reminder letter in the mail two months in advance. If you don't receive a letter, please call our office to schedule the follow-up appointment.   Any Other Special Instructions Will Be Listed Below (If Applicable).  I have sent in a referral for  behavioral health. Someone will contact you soon to schedule an appointment.    If you need a refill on your cardiac medications before your next appointment, please call your pharmacy.

## 2016-02-01 ENCOUNTER — Telehealth (HOSPITAL_COMMUNITY): Payer: Self-pay | Admitting: *Deleted

## 2016-02-01 NOTE — Telephone Encounter (Signed)
LEFT VOICE MESSAGE REGARDING AN APPOINTMENT. 

## 2016-02-07 ENCOUNTER — Ambulatory Visit: Payer: BLUE CROSS/BLUE SHIELD | Admitting: Cardiology

## 2016-02-23 ENCOUNTER — Encounter (HOSPITAL_COMMUNITY): Payer: Self-pay | Admitting: Psychiatry

## 2016-02-23 ENCOUNTER — Ambulatory Visit (INDEPENDENT_AMBULATORY_CARE_PROVIDER_SITE_OTHER): Payer: BLUE CROSS/BLUE SHIELD | Admitting: Psychiatry

## 2016-02-23 ENCOUNTER — Telehealth: Payer: Self-pay | Admitting: Cardiology

## 2016-02-23 VITALS — BP 122/90 | HR 61 | Ht 74.0 in | Wt 220.0 lb

## 2016-02-23 DIAGNOSIS — F321 Major depressive disorder, single episode, moderate: Secondary | ICD-10-CM

## 2016-02-23 DIAGNOSIS — F410 Panic disorder [episodic paroxysmal anxiety] without agoraphobia: Secondary | ICD-10-CM | POA: Diagnosis not present

## 2016-02-23 MED ORDER — SERTRALINE HCL 50 MG PO TABS: 50.0000 mg | ORAL_TABLET | Freq: Every day | ORAL | 3 refills | Status: DC

## 2016-02-23 NOTE — Patient Instructions (Signed)
1. Start sertraline 25 mg daily for two weeks, then 50 mg daily 2. Referral to psychotherapy 3. Return to clinic in one month

## 2016-02-23 NOTE — Progress Notes (Signed)
Psychiatric Initial Adult Assessment   Patient Identification: Travis Tapia MRN:  [id number] Date of Evaluation:  02/23/2016 Referral Source: Fonnie Birkenhead, CMA Chief Complaint:   Chief Complaint    New Evaluation; Depression; Anxiety     Visit Diagnosis:    ICD-9-CM ICD-10-CM   1. Major depressive disorder, single episode, moderate (HCC) 296.22 F32.1   2. Panic attacks 300.01 F41.0     History of Present Illness:   Travis Tapia is  51 year old male with depression, CAD, who is referred for depression and anxiety.   Patient states that there has been worsening anxiety since January 2015 when he had heart attack. He feels that "it (heart attack) changed me so much "; he feels more anxious and short tempered. He feels sad that he is unable to control his anxiety. Although he usually walks away from confrontation when he is with other people, he easily gets mad at his children and his wife. He becomes loud and yelling, although he denies any physical altercation. He also reports occasional intense anxiety with palpitation, shortness of breath, numbness. He reports he has it while he is in the interview. It occurs regardless of the situation, but he finds it more often when he is by himself, as it is difficult for him to distract himself.  He reports "not so good". He endorses low energy and he needs to force himself to go to work or take care of his children. He does not exercise as he used to. He feels slightly detached from his wife and his children, although he loves them. He sleeps 6-7 hours with night time awakening; he does not feels rest in the morning. He reports difficulty concentration. He adamantly denies any SI, stating that he has children. He denies HI/AH/VH. He denies nightmares or flashback about his heart attack. He reports history of seasonal depression in 2009-2010 and was briefly on light therapy and antidepressant (does not remember the name). He drinks 1-2 beers per week,  he denies any drug use.  Associated Signs/Symptoms: Depression Symptoms:  depressed mood, anhedonia, insomnia, fatigue, difficulty concentrating, anxiety, panic attacks, (Hypo) Manic Symptoms:  denies Anxiety Symptoms:  Excessive Worry, Panic Symptoms, Psychotic Symptoms:  denies PTSD Symptoms: Negative  Past Psychiatric History: seasonal affective disorder No psychiatry admission  Previous Psychotropic Medications: Yes   Substance Abuse History in the last 12 months:  No.  Consequences of Substance Abuse: NA  Past Medical History:  Past Medical History:  Diagnosis Date  . Arachnoid cyst    Cerebellum - followed at [facility]  . CAD (coronary artery disease)    DES to RCA 06/2014, DES to LAD 06/2014  . Depression   . 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 APPENDECTOMY    . HERNIA REPAIR      Family Psychiatric History: father- depression, paternal grandmother- depression No suicide attempt  Family History:  Family History  Problem Relation Age of Onset  . Heart disease Father     CABG  . Stroke Father   . Hyperlipidemia Father   . Hypertension Mother     Social History:   Social History   Social History  . Marital status: Married    Spouse name: N/A  . Number of children: N/A  . Years of education: N/A   Social History Main Topics  . Smoking status: Former Smoker    Quit date:  06/04/2006  . Smokeless tobacco: Current User    Types: Chew  . Alcohol use 1.2 oz/week    2 Cans of beer per week     Comment: beer twice a week.02-23-2016 1-2 beers per week  . Drug use: No     Comment: 02-23-2016 per pt no  . Sexual activity: Yes   Other Topics Concern  . None   Social History Narrative  . 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 aspirin 81 MG tablet Take 81 mg by mouth daily.    Marland Kitchen atenolol (TENORMIN) 25 MG tablet Take 0.5 tablets (12.5 mg total) by mouth daily. 30 tablet 6  . atorvastatin (LIPITOR) 80 MG tablet Take 80 mg by mouth at bedtime.    . clopidogrel (PLAVIX) 75 MG tablet Take 75 mg by mouth every morning.     . nitroGLYCERIN (NITROSTAT) 0.4 MG SL tablet Place 1 tablet (0.4 mg total) under the tongue every 5 (five) minutes as needed for chest pain. 25 tablet 3  . ranitidine (ZANTAC) 150 MG tablet Take 150 mg by mouth 2 (two) times daily.    Marland Kitchen SALINE NASAL MIST NA Place 1-2 sprays into the nose daily as needed (for congestion).    . Cholecalciferol ([facility] D3) 2000 UNITS CAPS Take 1 capsule by mouth daily.     . sertraline (ZOLOFT) 50 MG tablet Take 1 tablet (50 mg total) by mouth daily. Take 25 mg daily for two weeks, then 50 mg daily 30 tablet 3   No current facility-administered medications for this visit.     Neurologic: Headache: No Seizure: No Paresthesias:No  Musculoskeletal: Strength & Muscle Tone: within normal limits Gait & Station: normal Patient leans: N/A  Psychiatric Specialty Exam: Review of Systems  Cardiovascular: Positive for palpitations.  Psychiatric/Behavioral: Positive for depression. Negative for hallucinations, substance abuse and suicidal ideas. The patient is nervous/anxious and has insomnia.   All other systems reviewed and are negative.   Blood pressure 122/90, pulse 61, height 6\' 2"  (1.88 m), weight 220 lb (99.8 kg), SpO2 97 %.Body mass index is 28.25 kg/m.  General Appearance: Casual  Eye Contact:  Good  Speech:  Clear and Coherent  Volume:  Normal  Mood:  sad  Affect:  Blunt  Thought Process:  Coherent and Goal Directed   Orientation:  Full (Time, Place, and Person)  Thought Content:  Logical  Suicidal Thoughts:  No  Homicidal Thoughts:  No  Memory:  Negative  Judgement:  Good  Insight:  Good  Psychomotor Activity:  Normal  Concentration:  Concentration: Fair and Attention Span: Fair  Recall:  Good  Fund of Knowledge:Good  Language: Good  Akathisia:  No  Handed:  Right  AIMS (if indicated):  N/A  Assets:  Communication Skills Desire for Improvement  ADL's:  Intact  Cognition: WNL  Sleep:  poor   Assessment Travis Tapia is  50 year old male with depression, CAD, who is referred for depression and anxiety.   # MDD # r/o seasonal affective disorder # r/o GAD Patient endorses neurovegetative symptoms and anxiety with occasional  panic attacks since he had MI. Will start sertraline to target his mood symptoms. He describes anticipatory anxiety and this can be a great target for psychotherapy; will make a referral. Discussed behavioral activation and light therapy given his history of seasonal affective disorder.   Plans  - Sertraline 25 mg daily for two weeks, then 50 mg daily - Referral to CBT - RTC in one month  Treatment Plan Summary: Medication management  The patient demonstrates the following  risk factors for suicide: Chronic risk factors for suicide include psychiatric disorder /depression, medical illness /CAD. Acute risk factors for suicide include none.  Protective factors for this patient include positive social support, positive therapeutic relationship, responsibility to others (children, family), coping skills, hope for the future.  Considering these factors, the overall suicide risk at this point appears to be low.  Travis Hottereina Jordynn Perrier, MD 9/21/20179:38 AM

## 2016-02-23 NOTE — Telephone Encounter (Signed)
clopidogrel (PLAVIX) 75 MG tablet   atorvastatin (LIPITOR) 80 MG tablet    [facility] [place]  [address]

## 2016-02-24 MED ORDER — ATORVASTATIN CALCIUM 80 MG PO TABS
80.0000 mg | ORAL_TABLET | Freq: Every day | ORAL | 1 refills | Status: DC
Start: 2016-02-24 — End: 2016-09-18

## 2016-02-24 MED ORDER — CLOPIDOGREL BISULFATE 75 MG PO TABS: 75.0000 mg | ORAL_TABLET | Freq: Every morning | ORAL | 1 refills | Status: DC

## 2016-02-24 NOTE — Telephone Encounter (Signed)
Refill complete 

## 2016-03-14 ENCOUNTER — Ambulatory Visit: Payer: BLUE CROSS/BLUE SHIELD | Admitting: Cardiology

## 2016-03-16 ENCOUNTER — Other Ambulatory Visit: Payer: Self-pay | Admitting: Cardiology

## 2016-03-16 NOTE — Telephone Encounter (Signed)
REFILL:   Needs refill on atorvastatin (LIPITOR) 80 MG tablet  [facility] [place]

## 2016-03-16 NOTE — Telephone Encounter (Signed)
Message left on voice mail that this refill was done on 02/24/16, please check with pharm.

## 2016-03-21 NOTE — Progress Notes (Signed)
BH MD/PA/NP OP Progress Note  03/23/2016 8:12 AM Travis LiasReuben K Tapia  MRN:  [id number]  Chief Complaint:  Chief Complaint    Follow-up; Depression     Subjective:  "I'm doing better" HPI:  Patient states that he has been doing better since the last encounter. He states that he feels less irritable, and does not react on small things as he used to. He enjoys being with his children and his wife, and reading a book. He feels scared and has some anxiety about heart attack, although he denies any nightmares or flashback. He believes that his wife who suffered from leukemia as a child [facility]. He also talks about two miscarriages int he past. He denies anxiety, insomnia. He denies SI.   Visit Diagnosis:    ICD-9-CM ICD-10-CM   1. Major depressive disorder, single episode, moderate (HCC) 296.22 F32.1     Past Psychiatric History:  Outpatient: seasonal affective disorder Psychiatry admission: denies Previous suicide attempt: denies Past trials of medication: some  antidepressant (do not remember the name)  Past Medical History:  Past Medical History:  Diagnosis Date  . Arachnoid cyst    [facility]  . CAD (coronary artery disease)    DES to RCA 06/2014, DES to LAD 06/2014  . Depression   . 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 APPENDECTOMY    . HERNIA REPAIR      Family Psychiatric History:  father- depression, paternal grandmother- depression No suicide attempt  Family History:  Family History  Problem Relation Age of Onset  . Heart disease Father     CABG  . Stroke Father   . Hyperlipidemia Father   . Hypertension Mother     Social History:  Social History   Social History  . Marital status: Married    Spouse name: N/A  . Number of children: N/A  . Years of education: N/A   Social History Main Topics  . Smoking status: Former Smoker    Quit date:  06/04/2006  . Smokeless tobacco: Current User    Types: Chew  . Alcohol use 1.2 oz/week    2 Cans of beer per week     Comment: beer twice a week.02-23-2016 1-2 beers per week  . Drug use: No     Comment: 02-23-2016 per pt no  . Sexual activity: Yes   Other Topics Concern  . None   Social History Narrative  . None    Allergies: No Known Allergies  Metabolic Disorder Labs: Lab Results  Component Value Date   HGBA1C 5.4 01/20/2016   MPG 108 01/20/2016   No results found for: PROLACTIN No results found for: CHOL, TRIG, HDL, CHOLHDL, VLDL, LDLCALC   Current Medications: Current Outpatient Prescriptions  Medication Sig Dispense Refill  . aspirin 81 MG tablet Take 81 mg by mouth daily.    Marland Kitchen. atenolol (TENORMIN) 25 MG tablet Take 0.5 tablets (12.5 mg total) by mouth daily. 30 tablet 6  . atorvastatin (LIPITOR) 80 MG tablet Take 1 tablet (80 mg total) by mouth at bedtime. 90 tablet 1  . Cholecalciferol (CVS D3) 2000 UNITS CAPS Take 1 capsule by mouth daily.     . clopidogrel (PLAVIX) 75 MG tablet Take 1 tablet (75 mg total) by mouth every morning. 90 tablet 1  . nitroGLYCERIN (NITROSTAT) 0.4 MG SL tablet Place 1 tablet (0.4 mg  total) under the tongue every 5 (five) minutes as needed for chest pain. 25 tablet 3  . ranitidine (ZANTAC) 150 MG tablet Take 150 mg by mouth 2 (two) times daily.    Marland Kitchen SALINE NASAL MIST NA Place 1-2 sprays into the nose daily as needed (for congestion).    . sertraline (ZOLOFT) 50 MG tablet Take 1 tablet (50 mg total) by mouth daily. Take 25 mg daily for two weeks, then 50 mg daily 30 tablet 3   No current facility-administered medications for this visit.     Neurologic: Headache: No Seizure: No Paresthesias: No  Musculoskeletal: Strength & Muscle Tone: within normal limits Gait & Station: normal Patient leans: N/A  Psychiatric Specialty Exam: Review of Systems  Psychiatric/Behavioral: Negative for depression, hallucinations, substance abuse and  suicidal ideas. The patient is nervous/anxious. The patient does not have insomnia.   All other systems reviewed and are negative.   Height 6\' 2"  (1.88 m).There is no height or weight on file to calculate BMI.  General Appearance: Casual  Eye Contact:  Fair  Speech:  Normal Rate  Volume:  Normal  Mood:  "good"  Affect:  Appropriate, calm  Thought Process:  Coherent and Goal Directed  Orientation:  Full (Time, Place, and Person)  Thought Content: Logical  Perceptions: denies AH/VH  Suicidal Thoughts:  No  Homicidal Thoughts:  No  Memory:  Immediate;   Good Recent;   Good Remote;   Good  Judgement:  Good  Insight:  Good  Psychomotor Activity:  Normal  Concentration:  Concentration: Good and Attention Span: Good  Recall:  Good  Fund of Knowledge: Good  Language: Good  Akathisia:  No  Handed:  Right  AIMS (if indicated):  N/A  Assets:  Communication Skills Desire for Improvement Social Support  ADL's:  Intact  Cognition: WNL  Sleep:  good   Assessment Travis Tapia is  50 year old male with depression, CAD, who is referred for depression and anxiety. Psychosocial stressors including heart attack and his wife who suffered from leukemia as a child.  # MDD # r/o seasonal affective disorder # r/o GAD There has been significant improvement in his neurovegetative symptoms and anxiety since starting sertraline.Will continue current dose. Patient declines the option of psychotherapy at this time given his improvement in his symptoms.   Plans  - Continue sertraline 50 mg daily - RTC in 2 months  Treatment Plan Summary: Medication management  The patient demonstrates the following  risk factors for suicide: Chronic risk factors for suicide include psychiatric disorder /depression, medical illness /CAD. Acute risk factors for suicide include none.  Protective factors for this patient include positive social support, positive therapeutic relationship, responsibility to others  (children, family), coping skills, hope for the future.  Considering these factors, the overall suicide risk at this point appears to be low.  Neysa Hotter, MD 03/23/2016, 8:12 AM

## 2016-03-23 ENCOUNTER — Encounter (HOSPITAL_COMMUNITY): Payer: Self-pay | Admitting: Psychiatry

## 2016-03-23 ENCOUNTER — Encounter (HOSPITAL_COMMUNITY): Payer: Self-pay | Admitting: *Deleted

## 2016-03-23 ENCOUNTER — Ambulatory Visit (INDEPENDENT_AMBULATORY_CARE_PROVIDER_SITE_OTHER): Payer: BLUE CROSS/BLUE SHIELD | Admitting: Psychiatry

## 2016-03-23 VITALS — BP 124/79 | HR 56 | Ht 74.0 in | Wt 220.0 lb

## 2016-03-23 DIAGNOSIS — F321 Major depressive disorder, single episode, moderate: Secondary | ICD-10-CM | POA: Diagnosis not present

## 2016-03-23 DIAGNOSIS — Z87891 Personal history of nicotine dependence: Secondary | ICD-10-CM | POA: Diagnosis not present

## 2016-03-23 DIAGNOSIS — Z7982 Long term (current) use of aspirin: Secondary | ICD-10-CM

## 2016-03-23 DIAGNOSIS — Z8249 Family history of ischemic heart disease and other diseases of the circulatory system: Secondary | ICD-10-CM

## 2016-03-23 DIAGNOSIS — Z823 Family history of stroke: Secondary | ICD-10-CM

## 2016-03-23 DIAGNOSIS — Z79899 Other long term (current) drug therapy: Secondary | ICD-10-CM

## 2016-03-23 MED ORDER — SERTRALINE HCL 50 MG PO TABS: 50.0000 mg | ORAL_TABLET | Freq: Every day | ORAL | 2 refills | Status: DC

## 2016-03-23 NOTE — Patient Instructions (Signed)
1. Continue sertraline 50 mg daily 2. Return to clinic in 2 months

## 2016-04-16 ENCOUNTER — Telehealth (HOSPITAL_COMMUNITY): Payer: Self-pay | Admitting: *Deleted

## 2016-04-16 NOTE — Telephone Encounter (Signed)
left voice message, provider out of office 06/05/16.

## 2016-06-05 ENCOUNTER — Ambulatory Visit (HOSPITAL_COMMUNITY): Payer: Self-pay | Admitting: Psychiatry

## 2016-06-05 ENCOUNTER — Telehealth (HOSPITAL_COMMUNITY): Payer: Self-pay | Admitting: *Deleted

## 2016-06-05 NOTE — Telephone Encounter (Signed)
patient came in to office today for appointment, although a voice message was left for him on 04/16/16, stating that the provider would be out of the office on 06/05/16.   Patient said he do not wish to reschedule an appointment.   Said if he needs the doctor, he will call her.

## 2016-06-05 NOTE — Telephone Encounter (Signed)
appt was sch for pt

## 2016-06-05 NOTE — Telephone Encounter (Signed)
Called pt to resch appt and asked him for more details as to why he didn't want to resch appt. Informed pt that due to provider still refilling his Zoloft office had to resch appt unless he just wish to not see provider anymore. Per pt he was rushing back to work so that's why he said that but he can go ahead and resch appt.

## 2016-06-15 ENCOUNTER — Other Ambulatory Visit: Payer: Self-pay | Admitting: Cardiology

## 2016-06-21 NOTE — Progress Notes (Deleted)
BH MD/PA/NP OP Progress Note  06/21/2016 8:29 PM Roque LiasReuben K Ornstein  MRN:  [id number]  Chief Complaint:   Subjective:  "I'm doing better" HPI:  Patient states that he has been doing better since the last encounter. He states that he feels less irritable, and does not react on small things as he used to. He enjoys being with his children and his wife, and reading a book. He feels scared and has some anxiety about heart attack, although he denies any nightmares or flashback. He believes that his wife who suffered from leukemia as a child also benefit [facility]. He also talks about two miscarriages int he past. He denies anxiety, insomnia. He denies SI.   Visit Diagnosis:  No diagnosis found.  Past Psychiatric History:  Outpatient: seasonal affective disorder Psychiatry admission: denies Previous suicide attempt: denies Past trials of medication: some  antidepressant (do not remember the name)  Past Medical History:  Past Medical History:  Diagnosis Date  . Arachnoid cyst    [facility]  . CAD (coronary artery disease)    DES to RCA 06/2014, DES to LAD 06/2014  . Depression   . 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 APPENDECTOMY    . HERNIA REPAIR      Family Psychiatric History:  father- depression, paternal grandmother- depression No suicide attempt  Family History:  Family History  Problem Relation Age of Onset  . Heart disease Father     CABG  . Stroke Father   . Hyperlipidemia Father   . Hypertension Mother     Social History:  Social History   Social History  . Marital status: Married    Spouse name: N/A  . Number of children: N/A  . Years of education: N/A   Social History Main Topics  . Smoking status: Former Smoker    Quit date: 06/04/2006  . Smokeless tobacco: Current User    Types: Chew  . Alcohol use 1.2 oz/week    2 Cans of beer per week   Comment: beer twice a week.02-23-2016 1-2 beers per week  . Drug use: No     Comment: 02-23-2016 per pt no  . Sexual activity: Yes   Other Topics Concern  . Not on file   Social History Narrative  . No narrative on file    Allergies: No Known Allergies  Metabolic Disorder Labs: Lab Results  Component Value Date   HGBA1C 5.4 01/20/2016   MPG 108 01/20/2016   No results found for: PROLACTIN No results found for: CHOL, TRIG, HDL, CHOLHDL, VLDL, LDLCALC   Current Medications: Current Outpatient Prescriptions  Medication Sig Dispense Refill  . aspirin 81 MG tablet Take 81 mg by mouth daily.    Marland Kitchen. atenolol (TENORMIN) 25 MG tablet Take 0.5 tablets (12.5 mg total) by mouth daily. 30 tablet 6  . atorvastatin (LIPITOR) 80 MG tablet Take 1 tablet (80 mg total) by mouth at bedtime. 90 tablet 1  . Cholecalciferol (CVS D3) 2000 UNITS CAPS Take 1 capsule by mouth daily.     . clopidogrel (PLAVIX) 75 MG tablet TAKE 1 TABLET (75 MG TOTAL) BY MOUTH EVERY MORNING. 90 tablet 1  . nitroGLYCERIN (NITROSTAT) 0.4 MG SL tablet Place 1 tablet (0.4 mg total) under the tongue every 5 (five) minutes as needed for chest pain. 25 tablet 3  . ranitidine (ZANTAC) 150 MG tablet Take  150 mg by mouth 2 (two) times daily.    Marland Kitchen SALINE NASAL MIST NA Place 1-2 sprays into the nose daily as needed (for congestion).    . sertraline (ZOLOFT) 50 MG tablet Take 1 tablet (50 mg total) by mouth daily. Take 25 mg daily for two weeks, then 50 mg daily 30 tablet 2   No current facility-administered medications for this visit.     Neurologic: Headache: No Seizure: No Paresthesias: No  Musculoskeletal: Strength & Muscle Tone: within normal limits Gait & Station: normal Patient leans: N/A  Psychiatric Specialty Exam: Review of Systems  Psychiatric/Behavioral: Negative for depression, hallucinations, substance abuse and suicidal ideas. The patient is nervous/anxious. The patient does not have insomnia.   All other  systems reviewed and are negative.   There were no vitals taken for this visit.There is no height or weight on file to calculate BMI.  General Appearance: Casual  Eye Contact:  Fair  Speech:  Normal Rate  Volume:  Normal  Mood:  "good"  Affect:  Appropriate, calm  Thought Process:  Coherent and Goal Directed  Orientation:  Full (Time, Place, and Person)  Thought Content: Logical  Perceptions: denies AH/VH  Suicidal Thoughts:  No  Homicidal Thoughts:  No  Memory:  Immediate;   Good Recent;   Good Remote;   Good  Judgement:  Good  Insight:  Good  Psychomotor Activity:  Normal  Concentration:  Concentration: Good and Attention Span: Good  Recall:  Good  Fund of Knowledge: Good  Language: Good  Akathisia:  No  Handed:  Right  AIMS (if indicated):  N/A  Assets:  Communication Skills Desire for Improvement Social Support  ADL's:  Intact  Cognition: WNL  Sleep:  good   Assessment Travis Tapia is  51 year old male with depression, CAD, who is referred for depression and anxiety. Psychosocial stressors including heart attack and his wife who suffered from leukemia as a child.  # MDD # r/o seasonal affective disorder # r/o GAD There has been significant improvement in his neurovegetative symptoms and anxiety since starting sertraline.Will continue current dose. Patient declines the option of psychotherapy at this time given his improvement in his symptoms.   Plans  - Continue sertraline 50 mg daily - RTC in 2 months  Treatment Plan Summary: Medication management  The patient demonstrates the following  risk factors for suicide: Chronic risk factors for suicide include psychiatric disorder /depression, medical illness /CAD. Acute risk factors for suicide include none.  Protective factors for this patient include positive social support, positive therapeutic relationship, responsibility to others (children, family), coping skills, hope for the future.  Considering these  factors, the overall suicide risk at this point appears to be low.  Neysa Hotter, MD 06/21/2016, 8:29 PM

## 2016-06-22 ENCOUNTER — Ambulatory Visit (HOSPITAL_COMMUNITY): Payer: Self-pay | Admitting: Psychiatry

## 2016-09-18 ENCOUNTER — Other Ambulatory Visit: Payer: Self-pay | Admitting: Cardiology

## 2016-09-20 ENCOUNTER — Telehealth (HOSPITAL_COMMUNITY): Payer: Self-pay | Admitting: *Deleted

## 2016-09-20 NOTE — Telephone Encounter (Signed)
[facility] Sertraline HCL 50 mg QD. Pt medication was last filled on 03-23-2016 which was the last time saw provider. Pt no showed for his f/u appt on 06-22-2016. Pt initial appt with provider was 02-23-2016. Pt do not have f/u appt. Pt pharmacy number is [phone].

## 2016-09-20 NOTE — Telephone Encounter (Signed)
No refill unless follow up appointment.

## 2016-09-20 NOTE — Telephone Encounter (Signed)
Pt pharmacy called to f/u on refill request and informed them what Dr. Vanetta Shawl stated and called stated ok they will let pt know.

## 2016-09-30 ENCOUNTER — Other Ambulatory Visit: Payer: Self-pay | Admitting: Cardiology

## 2016-10-04 ENCOUNTER — Encounter (HOSPITAL_COMMUNITY): Payer: Self-pay | Admitting: Psychiatry

## 2016-10-04 ENCOUNTER — Ambulatory Visit (INDEPENDENT_AMBULATORY_CARE_PROVIDER_SITE_OTHER): Payer: BLUE CROSS/BLUE SHIELD | Admitting: Psychiatry

## 2016-10-04 VITALS — BP 126/85 | HR 60 | Ht 74.0 in | Wt 230.0 lb

## 2016-10-04 DIAGNOSIS — F321 Major depressive disorder, single episode, moderate: Secondary | ICD-10-CM

## 2016-10-04 DIAGNOSIS — Z79899 Other long term (current) drug therapy: Secondary | ICD-10-CM

## 2016-10-04 DIAGNOSIS — Z87891 Personal history of nicotine dependence: Secondary | ICD-10-CM

## 2016-10-04 DIAGNOSIS — Z7982 Long term (current) use of aspirin: Secondary | ICD-10-CM

## 2016-10-04 DIAGNOSIS — Z818 Family history of other mental and behavioral disorders: Secondary | ICD-10-CM | POA: Diagnosis not present

## 2016-10-04 MED ORDER — SERTRALINE HCL 100 MG PO TABS: ORAL_TABLET | ORAL | 1 refills | Status: DC

## 2016-10-04 NOTE — Progress Notes (Signed)
BH MD/PA/NP OP Progress Note  10/04/2016 9:32 AM Travis Tapia  MRN:  [id number]  Chief Complaint:  Chief Complaint    Depression; Follow-up     Subjective:  "I'm thinking about something" HPI:  He presents for follow up appointment. He states that he has been forgetting to make an appointment, after the appointment was cancelled due to snow. He reports that his mood got worse even before he ran out of sertraline four days ago. He reports issues with connection with his wife. It has been difficult to express himself. He feels irritable and gets easily upset.  He endorses very low energy and feels fatigue after work. He reports difficulty in concentration at work. He tends to eat more and gained weight. He denies SI. He reports insomnia. He denies AH/VH. He has panic attacks every day.  Wt Readings from Last 3 Encounters:  10/04/16 230 lb (104.3 kg)  03/23/16 220 lb (99.8 kg)  02/23/16 220 lb (99.8 kg)   Visit Diagnosis:    ICD-9-CM ICD-10-CM   1. Major depressive disorder, single episode, moderate (HCC) 296.22 F32.1     Past Psychiatric History:  Outpatient: seasonal affective disorder Psychiatry admission: denies Previous suicide attempt: denies Past trials of medication: some  antidepressant (do not remember the name)  Past Medical History:  Past Medical History:  Diagnosis Date  . Arachnoid cyst    Cerebellum - followed at [facility]  . CAD (coronary artery disease)    DES to RCA 06/2014, DES to LAD 06/2014  . Depression   . 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 APPENDECTOMY    . HERNIA REPAIR      Family Psychiatric History:  father- depression, paternal grandmother- depression No suicide attempt  Family History:  Family History  Problem Relation Age of Onset  . Heart disease Father     CABG  . Stroke Father   . Hyperlipidemia Father   . Hypertension Mother     Social History:   Social History   Social History  . Marital status: Married    Spouse name: N/A  . Number of children: N/A  . Years of education: N/A   Social History Main Topics  . Smoking status: Former Smoker    Quit date: 06/04/2006  . Smokeless tobacco: Current User    Types: Chew  . Alcohol use 1.2 oz/week    2 Cans of beer per week     Comment: beer twice a week.02-23-2016 1-2 beers per week  . Drug use: No     Comment: 02-23-2016 per pt no  . Sexual activity: Yes   Other Topics Concern  . None   Social History Narrative  . None    Allergies: No Known Allergies  Metabolic Disorder Labs: Lab Results  Component Value Date   HGBA1C 5.4 01/20/2016   MPG 108 01/20/2016   No results found for: PROLACTIN No results found for: CHOL, TRIG, HDL, CHOLHDL, VLDL, LDLCALC   Current Medications: Current Outpatient Prescriptions  Medication Sig Dispense Refill  . aspirin 81 MG tablet Take 81 mg by mouth daily.    Marland Kitchen atenolol (TENORMIN) 25 MG tablet Take 0.5 tablets (12.5 mg total) by mouth daily. 30 tablet 6  . atorvastatin (LIPITOR) 80 MG tablet TAKE 1 TABLET (80 MG TOTAL) BY MOUTH AT BEDTIME. 90 tablet 1  . Cholecalciferol ([employer]S D3) 2000 UNITS CAPS Take 1 capsule by  mouth daily.     . clopidogrel (PLAVIX) 75 MG tablet TAKE 1 TABLET (75 MG TOTAL) BY MOUTH EVERY MORNING. 90 tablet 1  . nitroGLYCERIN (NITROSTAT) 0.4 MG SL tablet PLACE 1 TABLET (0.4 MG TOTAL) UNDER THE TONGUE EVERY 5 (FIVE) MINUTES AS NEEDED FOR CHEST PAIN. 25 tablet 3  . ranitidine (ZANTAC) 150 MG tablet Take 150 mg by mouth 2 (two) times daily.    Marland Kitchen. SALINE NASAL MIST NA Place 1-2 sprays into the nose daily as needed (for congestion).    . sertraline (ZOLOFT) 100 MG tablet 50 mg daily for one week, then 100 mg daily 30 tablet 1   No current facility-administered medications for this visit.     Neurologic: Headache: No Seizure: No Paresthesias: No  Musculoskeletal: Strength & Muscle Tone: within normal limits Gait &  Station: normal Patient leans: N/A  Psychiatric Specialty Exam: Review of Systems  Psychiatric/Behavioral: Positive for depression and memory loss. Negative for hallucinations, substance abuse and suicidal ideas. The patient is nervous/anxious and has insomnia.   All other systems reviewed and are negative.   Blood pressure 126/85, pulse 60, height 6\' 2"  (1.88 m), weight 230 lb (104.3 kg).Body mass index is 29.53 kg/m.  General Appearance: Casual  Eye Contact:  Fair  Speech:  Normal Rate  Volume:  Normal  Mood:  "not good"  Affect:  Congruent and down  Thought Process:  Coherent and Goal Directed  Orientation:  Full (Time, Place, and Person)  Thought Content: Logical  Perceptions: denies AH/VH  Suicidal Thoughts:  No  Homicidal Thoughts:  No  Memory:  Immediate;   Good Recent;   Good Remote;   Good  Judgement:  Good  Insight:  Good  Psychomotor Activity:  Decreased  Concentration:  Concentration: Fair and Attention Span: Fair  Recall:  Good  Fund of Knowledge: Good  Language: Good  Akathisia:  No  Handed:  Right  AIMS (if indicated):  N/A  Assets:  Communication Skills Desire for Improvement Social Support  ADL's:  Intact  Cognition: WNL  Sleep:  poor   Assessment Travis Tapia is  51 year old male with depression, CAD, who presents for follow up appointment for depression and anxiety. Psychosocial stressors including heart attack and his wife who suffered from leukemia as a child.  # MDD # r/o seasonal affective disorder # r/o GAD Exam is notable for his impaired attention and patient endorses worsening neurovegetative symptoms even before he ran out of his medication. Will plan to uptitrate sertraline to target his mood. Discussed side effect of nausea, vomiting and sexual dysfunction. Will discuss the option of psychotherapy again if he were to continue to have symptoms.   Plans  1. Start sertraline 50 mg daily for week, then 100 mg daily 2. Return to clinic in  one month  Treatment Plan Summary: Medication management  The patient demonstrates the following  risk factors for suicide: Chronic risk factors for suicide include psychiatric disorder /depression, medical illness /CAD. Acute risk factors for suicide include none.  Protective factors for this patient include positive social support, positive therapeutic relationship, responsibility to others (children, family), coping skills, hope for the future.  Considering these factors, the overall suicide risk at this point appears to be low.  Neysa Hottereina Yasemin Rabon, MD 10/04/2016, 9:32 AM

## 2016-10-04 NOTE — Patient Instructions (Addendum)
1. Start sertraline 50 mg daily for week for one week, then 100 mg daily 2. Return to clinic in one month

## 2016-12-06 ENCOUNTER — Other Ambulatory Visit (HOSPITAL_COMMUNITY): Payer: Self-pay | Admitting: *Deleted

## 2016-12-06 NOTE — Progress Notes (Signed)
BH MD/PA/NP OP Progress Note  12/10/2016 8:48 AM Travis Tapia  MRN:  [id number]  Chief Complaint:  Chief Complaint    Follow-up; Depression     Subjective:  "I don't let it take over me." HPI:  Patient presents for follow up appointment. He states that he did not notice any side effect since increasing sertraline. He reports marital discordance; his wife is more irritable these days after having back surgery for her pain. He finds it difficult to have time together by themselves as they have children. He feels scared when he hears news of somebody having heart attacks. He is occasionally worried that he might die from heart attacks. He has not been able to do exercise as he wishes, as he is afraid it might causes another heart attack, although he felt safe while he was exercising in the hospital. He feels fatigue and yawns frequently. He endorses insomnia and anhedonia. He has a fair concentration.  He denies SI. He has panic attacks once a week.   Wt Readings from Last 3 Encounters:  12/10/16 229 lb (103.9 kg)  10/04/16 230 lb (104.3 kg)  03/23/16 220 lb (99.8 kg)    Visit Diagnosis:    ICD-10-CM   1. Major depressive disorder, single episode, moderate (HCC) F32.1   2. Panic attacks F41.0     Past Psychiatric History:  I have reviewed the patient's psychiatry history in detail and updated the patient record. Outpatient: seasonal affective disorder Psychiatry admission: denies Previous suicide attempt: denies Past trials of medication: some  antidepressant (do not remember the name)  Past Medical History:  Past Medical History:  Diagnosis Date  . Arachnoid cyst    Cerebellum - followed at [facility]  . CAD (coronary artery disease)    DES to RCA 06/2014, DES to LAD 06/2014  . Depression   . 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 APPENDECTOMY    . HERNIA REPAIR      Family Psychiatric History:   I have reviewed the patient's family history in detail and updated the patient record. father- depression, paternal grandmother- depression  Family History:  Family History  Problem Relation Age of Onset  . Heart disease Father        CABG  . Stroke Father   . Hyperlipidemia Father   . Depression Father   . Hypertension Mother   . Depression Paternal Grandmother     Social History:  Social History   Social History  . Marital status: Married    Spouse name: N/A  . Number of children: N/A  . Years of education: N/A   Social History Main Topics  . Smoking status: Former Smoker    Quit date: 06/04/2006  . Smokeless tobacco: Current User    Types: Chew  . Alcohol use 1.2 oz/week    2 Cans of beer per week     Comment: beer twice a week.02-23-2016 1-2 beers per week  . Drug use: No     Comment: 02-23-2016 per pt no  . Sexual activity: Yes   Other Topics Concern  . None   Social History Narrative  . None    Allergies: No Known Allergies  Metabolic Disorder Labs: Lab Results  Component Value Date   HGBA1C 5.4 01/20/2016   MPG 108 01/20/2016   No results found for: PROLACTIN No results found for: CHOL, TRIG, HDL, CHOLHDL, VLDL, LDLCALC  Current Medications: Current Outpatient Prescriptions  Medication Sig Dispense Refill  . aspirin 81 MG tablet Take 81 mg by mouth daily.    Marland Kitchen atorvastatin (LIPITOR) 80 MG tablet TAKE 1 TABLET (80 MG TOTAL) BY MOUTH AT BEDTIME. 90 tablet 1  . Cholecalciferol (CVS D3) 2000 UNITS CAPS Take 1 capsule by mouth daily.     . clopidogrel (PLAVIX) 75 MG tablet TAKE 1 TABLET (75 MG TOTAL) BY MOUTH EVERY MORNING. 90 tablet 1  . nitroGLYCERIN (NITROSTAT) 0.4 MG SL tablet PLACE 1 TABLET (0.4 MG TOTAL) UNDER THE TONGUE EVERY 5 (FIVE) MINUTES AS NEEDED FOR CHEST PAIN. 25 tablet 3  . ranitidine (ZANTAC) 150 MG tablet Take 150 mg by mouth 2 (two) times daily.    Marland Kitchen SALINE NASAL MIST NA Place 1-2 sprays into the nose daily as needed (for congestion).     . sertraline (ZOLOFT) 100 MG tablet Take 1.5 tablets (150 mg total) by mouth daily. 30 tablet 1  . atenolol (TENORMIN) 25 MG tablet Take 0.5 tablets (12.5 mg total) by mouth daily. 30 tablet 6   No current facility-administered medications for this visit.     Neurologic: Headache: No Seizure: No Paresthesias: No  Musculoskeletal: Strength & Muscle Tone: within normal limits Gait & Station: normal Patient leans: N/A  Psychiatric Specialty Exam: Review of Systems  Constitutional: Positive for malaise/fatigue.  Psychiatric/Behavioral: Positive for depression. Negative for hallucinations, substance abuse and suicidal ideas. The patient is nervous/anxious and has insomnia.   All other systems reviewed and are negative.   Blood pressure 124/82, pulse 76, height 6\' 2"  (1.88 m), weight 229 lb (103.9 kg).Body mass index is 29.4 kg/m.  General Appearance: Casual  Eye Contact:  Good  Speech:  Clear and Coherent  Volume:  Normal  Mood:  tired  Affect:  Restricted and fatigue  Thought Process:  Restricted and fatigue  Orientation:  Full (Time, Place, and Person)  Thought Content: Logical Perceptions: denies AH/VH  Suicidal Thoughts:  No  Homicidal Thoughts:  No  Memory:  Immediate;   Good Recent;   Good Remote;   Good  Judgement:  Good  Insight:  Fair  Psychomotor Activity:  Normal  Concentration:  Concentration: Good and Attention Span: Good  Recall:  Good  Fund of Knowledge: Good  Language: Good  Akathisia:  No  Handed:  Ambidextrous  AIMS (if indicated):  N/A  Assets:  Communication Skills Desire for Improvement  ADL's:  Intact  Cognition: WNL  Sleep:  poor   Assessment SAID RUEB is 51 year old male with depression, CAD. Patient presents for follow up appointment for Major depressive disorder, single episode, moderate (HCC)  Panic attacks Psychosocial stressors including heart attack and marital discordance.  # MDD, moderate, recurrent without psychotic  features Exam is notable for fatigue, and patient continues to endorse neurovegetative symptoms in the setting of marital discordance. Will uptitrate sertraline to optimize its effect for depression. He is instructed to try taking it at night given he reports some somnolence during the day. 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 Discussed cognitive reconstruction. He will greatly benefit from CBT; advised to consider this option.   Plans  1. Increase sertraline 150 mg at night 2. Return to clinic in one month for 30 mins 3. Consider therapy   The patient demonstrates the following risk factors for suicide: Chronic risk factorsfor suicide include psychiatric disorder /depression,medical illness/CAD. Acute risk factorsfor suicide include  none.Protective factorsfor this patient include positive social support, positive therapeutic relationship, responsibility to others (children, family), coping skills, hope for the future. Considering these factors, the overall suicide risk at this point appears to be low.  Treatment Plan Summary:Plan as above  The duration of this appointment visit was 30 minutes of face-to-face time with the patient.  Greater than 50% of this time was spent in counseling, explanation of  diagnosis, planning of further management, and coordination of care.  Neysa Hottereina Emmalynn Pinkham, MD 12/10/2016, 8:48 AM

## 2016-12-06 NOTE — Telephone Encounter (Signed)
Please call the patient for appointment. Will not order refill unless he has follow up appointment.

## 2016-12-06 NOTE — Telephone Encounter (Signed)
Spoke with patient & informed that Per Dr Vanetta ShawlHisada :patient needs a appointment. Will not order refill unless he has follow up appointment. Patient stated that he forgot & appointment is begin made today

## 2016-12-10 ENCOUNTER — Ambulatory Visit (INDEPENDENT_AMBULATORY_CARE_PROVIDER_SITE_OTHER): Payer: BLUE CROSS/BLUE SHIELD | Admitting: Psychiatry

## 2016-12-10 ENCOUNTER — Encounter (HOSPITAL_COMMUNITY): Payer: Self-pay | Admitting: Psychiatry

## 2016-12-10 VITALS — BP 124/82 | HR 76 | Ht 74.0 in | Wt 229.0 lb

## 2016-12-10 DIAGNOSIS — G4700 Insomnia, unspecified: Secondary | ICD-10-CM

## 2016-12-10 DIAGNOSIS — F410 Panic disorder [episodic paroxysmal anxiety] without agoraphobia: Secondary | ICD-10-CM

## 2016-12-10 DIAGNOSIS — Z87891 Personal history of nicotine dependence: Secondary | ICD-10-CM | POA: Diagnosis not present

## 2016-12-10 DIAGNOSIS — Z818 Family history of other mental and behavioral disorders: Secondary | ICD-10-CM | POA: Diagnosis not present

## 2016-12-10 DIAGNOSIS — F321 Major depressive disorder, single episode, moderate: Secondary | ICD-10-CM

## 2016-12-10 MED ORDER — SERTRALINE HCL 100 MG PO TABS: 150.0000 mg | ORAL_TABLET | Freq: Every day | ORAL | 1 refills | Status: DC

## 2016-12-10 NOTE — Patient Instructions (Addendum)
1. Increase sertraline 150 mg at night 2. Return to clinic in one month for 30 mins 3. Consider therapy

## 2016-12-15 ENCOUNTER — Other Ambulatory Visit: Payer: Self-pay | Admitting: Cardiology

## 2017-01-02 NOTE — Progress Notes (Signed)
Cardiology Office Note  Date: 01/03/2017   ID: Travis LiasReuben K Sage, DOB 1966-02-08, MRN [id number]  PCP: Majel HomerBuchanan, Linda, MD  Primary Cardiologist: Nona DellSamuel McDowell, MD   Chief Complaint  Patient presents with  . Coronary Artery Disease    History of Present Illness: Travis Tapia is a 51 y.o. male last seen in August 2017. He presents for a routine follow-up visit. Reports doing well from a cardiac perspective, no angina symptoms or nitroglycerin use. She continues to work full time. Has not been exercising very regularly, we talked bout this some today. He is family are preparing to move into a new home.  We went over his medications which are listed below. He reports no intolerances. States that he has follow-up lab work and physical with PCP in the next few weeks. He remains on Lipitor.  Stress testing and lab work from last year are reviewed below.  Past Medical History:  Diagnosis Date  . Arachnoid cyst    C[facility]bellum - [facility]  . CAD (coronary artery disease)    DES to RCA 06/2014, DES to LAD 06/2014  . Depression   . 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 APPENDECTOMY    . HERNIA REPAIR      Current Outpatient Prescriptions  Medication Sig Dispense Refill  . aspirin 81 MG tablet Take 81 mg by mouth daily.    Marland Kitchen. atenolol (TENORMIN) 25 MG tablet Take 0.5 tablets (12.5 mg total) by mouth daily. 30 tablet 6  . atorvastatin (LIPITOR) 80 MG tablet TAKE 1 TABLET (80 MG TOTAL) BY MOUTH AT BEDTIME. 90 tablet 1  . Cholecalciferol (CVS D3) 2000 UNITS CAPS Take 1 capsule by mouth daily.     . clopidogrel (PLAVIX) 75 MG tablet TAKE 1 TABLET (75 MG TOTAL) BY MOUTH EVERY MORNING. 30 tablet 0  . nitroGLYCERIN (NITROSTAT) 0.4 MG SL tablet PLACE 1 TABLET (0.4 MG TOTAL) UNDER THE TONGUE EVERY 5 (FIVE) MINUTES AS NEEDED FOR CHEST PAIN. 25 tablet 3  . ranitidine (ZANTAC) 150 MG tablet Take 150 mg by  mouth 2 (two) times daily.    Marland Kitchen. SALINE NASAL MIST NA Place 1-2 sprays into the nose daily as needed (for congestion).    . sertraline (ZOLOFT) 100 MG tablet Take 1.5 tablets (150 mg total) by mouth daily. 30 tablet 1   No current facility-administered medications for this visit.    Allergies:  Patient has no known allergies.   Social History: The patient  reports that he quit smoking about 10 years ago. His smokeless tobacco use includes Chew. He reports that he drinks about 1.2 oz of alcohol per week . He reports that he does not use drugs.   ROS:  Please see the history of present illness. Otherwise, complete review of systems is positive for none.  All other systems are reviewed and negative.   Physical Exam: VS:  BP 132/84 (BP Location: Right Arm)   Pulse 78   Ht 6\' 1"  (1.854 m)   Wt 230 lb (104.3 kg)   SpO2 97%   BMI 30.34 kg/m , BMI Body mass index is 30.34 kg/m.  Wt Readings from Last 3 Encounters:  01/03/17 230 lb (104.3 kg)  01/25/16 223 lb (101.2 kg)  01/21/16 220 lb 6.4 oz (100 kg)    General: Overweight male, appears comfortable at rest. HEENT: Conjunctiva and lids normal, oropharynx clear. Neck:  Supple, no elevated JVP or carotid bruits, no thyromegaly. Lungs: Clear to auscultation, nonlabored breathing at rest. Cardiac: Regular rate and rhythm, no S3 or significant systolic murmur, no pericardial rub. Abdomen: Soft, nontender, bowel sounds present, no guarding or rebound. Extremities: No pitting edema, distal pulses 2+. Skin: Warm and dry. Musculoskeletal: No kyphosis. Neuropsychiatric: Alert and oriented x3, affect grossly appropriate.  ECG: I personally reviewed the tracing from 01/12/2016 which showed sinus rhythm.  Recent Labwork: 01/21/2016: ALT 37; AST 28; BUN 9; Creatinine, Ser 0.73; Hemoglobin 14.4; Platelets 217; Potassium 3.3; Sodium 140   Other Studies Reviewed Today:  Myoview 01/21/2016: IMPRESSION: 1. Inferior attenuation artifact. No significant  inducible ischemia with exercise stress.  2. Normal left ventricular wall motion.  3. Left ventricular ejection fraction 51%  4. Non invasive risk stratification*: Low  Assessment and Plan:  1. CAD status post DES to the RCA and LAD in January 2017. He is stable on medical therapy without active angina symptoms. Recommended walking regimen for exercise. No changes were made today.  2. Hyperlipidemia, continues on Lipitor. Follow-up lab work pending with PCP.  3. History of depression. He reports better mood, now on Zoloft and [facility].  4. GERD, on Zantac. Tolerating antiplatelet therapy.  Current medicines were reviewed with the patient today.  Disposition: Follow-up in one year.  Signed, Jonelle SidleSamuel G. McDowell, MD, [facility]ACC 01/03/2017 12:02 PM    [facility] [address], [place], [place] [address] Phone: [phone]; Fax: [phone]

## 2017-01-03 ENCOUNTER — Ambulatory Visit (INDEPENDENT_AMBULATORY_CARE_PROVIDER_SITE_OTHER): Payer: BLUE CROSS/BLUE SHIELD | Admitting: Cardiology

## 2017-01-03 ENCOUNTER — Encounter: Payer: Self-pay | Admitting: Cardiology

## 2017-01-03 VITALS — BP 132/84 | HR 78 | Ht 73.0 in | Wt 230.0 lb

## 2017-01-03 DIAGNOSIS — F329 Major depressive disorder, single episode, unspecified: Secondary | ICD-10-CM

## 2017-01-03 DIAGNOSIS — E782 Mixed hyperlipidemia: Secondary | ICD-10-CM

## 2017-01-03 DIAGNOSIS — F32A Depression, unspecified: Secondary | ICD-10-CM

## 2017-01-03 DIAGNOSIS — K219 Gastro-esophageal reflux disease without esophagitis: Secondary | ICD-10-CM | POA: Diagnosis not present

## 2017-01-03 DIAGNOSIS — F419 Anxiety disorder, unspecified: Secondary | ICD-10-CM | POA: Diagnosis not present

## 2017-01-03 DIAGNOSIS — I25119 Atherosclerotic heart disease of native coronary artery with unspecified angina pectoris: Secondary | ICD-10-CM | POA: Diagnosis not present

## 2017-01-03 NOTE — Progress Notes (Deleted)
BH MD/PA/NP OP Progress Note  01/03/2017 4:12 PM Travis LiasReuben K Potier  MRN:  [id number]  Chief Complaint:  Subjective:  *** HPI: *** Visit Diagnosis: No diagnosis found.  Past Psychiatric History:  I have reviewed the patient's psychiatry history in detail and updated the patient record. Outpatient: seasonal affective disorder Psychiatry admission: denies Previous suicide attempt: denies Past trials of medication: some antidepressant (do not remember the name)  Past Medical History:  Past Medical History:  Diagnosis Date  . Arachnoid cyst    [facility]  . CAD (coronary artery disease)    DES to RCA 06/2014, DES to LAD 06/2014  . Depression   . 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 APPENDECTOMY    . HERNIA REPAIR      Family Psychiatric History:  I have reviewed the patient's family history in detail and updated the patient record. father- depression, paternal grandmother- depression  Family History:  Family History  Problem Relation Age of Onset  . Heart disease Father        CABG  . Stroke Father   . Hyperlipidemia Father   . Depression Father   . Hypertension Mother   . Depression Paternal Grandmother     Social History:  Social History   Social History  . Marital status: Married    Spouse name: N/A  . Number of children: N/A  . Years of education: N/A   Social History Main Topics  . Smoking status: Former Smoker    Quit date: 06/04/2006  . Smokeless tobacco: Current User    Types: Chew  . Alcohol use 1.2 oz/week    2 Cans of beer per week     Comment: beer twice a week.02-23-2016 1-2 beers per week  . Drug use: No     Comment: 02-23-2016 per pt no  . Sexual activity: Yes   Other Topics Concern  . Not on file   Social History Narrative  . No narrative on file    Allergies: No Known Allergies  Metabolic Disorder Labs: Lab Results  Component Value  Date   HGBA1C 5.4 01/20/2016   MPG 108 01/20/2016   No results found for: PROLACTIN No results found for: CHOL, TRIG, HDL, CHOLHDL, VLDL, LDLCALC   Current Medications: Current Outpatient Prescriptions  Medication Sig Dispense Refill  . aspirin 81 MG tablet Take 81 mg by mouth daily.    Marland Kitchen. atenolol (TENORMIN) 25 MG tablet Take 0.5 tablets (12.5 mg total) by mouth daily. 30 tablet 6  . atorvastatin (LIPITOR) 80 MG tablet TAKE 1 TABLET (80 MG TOTAL) BY MOUTH AT BEDTIME. 90 tablet 1  . Cholecalciferol ([employer] D3) 2000 UNITS CAPS Take 1 capsule by mouth daily.     . clopidogrel (PLAVIX) 75 MG tablet TAKE 1 TABLET (75 MG TOTAL) BY MOUTH EVERY MORNING. 30 tablet 0  . nitroGLYCERIN (NITROSTAT) 0.4 MG SL tablet PLACE 1 TABLET (0.4 MG TOTAL) UNDER THE TONGUE EVERY 5 (FIVE) MINUTES AS NEEDED FOR CHEST PAIN. 25 tablet 3  . ranitidine (ZANTAC) 150 MG tablet Take 150 mg by mouth 2 (two) times daily.    Marland Kitchen. SALINE NASAL MIST NA Place 1-2 sprays into the nose daily as needed (for congestion).    . sertraline (ZOLOFT) 100 MG tablet Take 1.5 tablets (150 mg total) by mouth daily. 30 tablet 1   No current facility-administered medications for this visit.  Neurologic: Headache: No Seizure: No Paresthesias: No  Musculoskeletal: Strength & Muscle Tone: within normal limits Gait & Station: normal Patient leans: N/A  Psychiatric Specialty Exam: ROS  There were no vitals taken for this visit.There is no height or weight on file to calculate BMI.  General Appearance: Fairly Groomed  Eye Contact:  Absent  Speech:  Clear and Coherent  Volume:  Normal  Mood:  {BHH MOOD:22306}  Affect:  {Affect (PAA):22687}  Thought Process:  Coherent and Goal Directed  Orientation:  Full (Time, Place, and Person)  Thought Content: Logical   Suicidal Thoughts:  {ST/HT (PAA):22692}  Homicidal Thoughts:  {ST/HT (PAA):22692}  Memory:  Immediate;   Good Recent;   Good Remote;   Good  Judgement:  {Judgement (PAA):22694}   Insight:  {Insight (PAA):22695}  Psychomotor Activity:  Normal  Concentration:  Concentration: Good and Attention Span: Good  Recall:  Good  Fund of Knowledge: Good  Language: Good  Akathisia:  No  Handed:  Ambidextrous  AIMS (if indicated):  N/A  Assets:  Communication Skills Desire for Improvement  ADL's:  Intact  Cognition: WNL  Sleep:  ***   Assessment Travis Tapia is a 51 y.o. year old male with a history of depression, CAD, who presents for follow up appointment for No diagnosis found.  # MDD, moderate, recurrent without psychotic features  Exam is notable for fatigue, and patient continues to endorse neurovegetative symptoms in the setting of marital discordance. Will uptitrate sertraline to optimize its effect for depression. He is instructed to try taking it at night given he reports some somnolence during the day. 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 Discussed cognitive reconstruction. He will greatly benefit from CBT; advised to consider this option.   Plans  1. Increase sertraline 150 mg at night 2. Return to clinic in one month for 30 mins 3. Consider therapy   The patient demonstrates the following risk factors for suicide: Chronic risk factorsfor suicide include psychiatric disorder /depression,medical illness/CAD. Acute risk factorsfor suicide include none.Protective factorsfor this patient include positive social support, positive therapeutic relationship, responsibility to others (children, family), coping skills, hope for the future. Considering these factors, the overall suicide risk at this point appears to be low.  Treatment Plan Summary:Plan as above   Neysa Hottereina Maryssa Giampietro, MD 01/03/2017, 4:12 PM

## 2017-01-03 NOTE — Patient Instructions (Addendum)
Your physician wants you to follow-up in: 1 year with Dr McDowell You will receive a reminder letter in the mail two months in advance. If you don't receive a letter, please call our office to schedule the follow-up appointment.    Your physician recommends that you continue on your current medications as directed. Please refer to the Current Medication list given to you today.     If you need a refill on your cardiac medications before your next appointment, please call your pharmacy.     Thank you for choosing [facility] !        

## 2017-01-08 ENCOUNTER — Ambulatory Visit (HOSPITAL_COMMUNITY): Payer: BLUE CROSS/BLUE SHIELD | Admitting: Psychiatry

## 2017-01-16 ENCOUNTER — Other Ambulatory Visit: Payer: Self-pay | Admitting: Cardiology

## 2017-02-12 ENCOUNTER — Telehealth (HOSPITAL_COMMUNITY): Payer: Self-pay | Admitting: *Deleted

## 2017-02-12 NOTE — Telephone Encounter (Signed)
Pt called wanting refills for his Zoloft 100 mg 1 tabs QD. Per pt he is out of this medication. Per pt chart, this medication was last filled on 12-10-2016 with 30 tabs 1 refill. Pt f/u appt is 01-20-2017. Pt number is [phone].

## 2017-02-13 ENCOUNTER — Other Ambulatory Visit (HOSPITAL_COMMUNITY): Payer: Self-pay | Admitting: Psychiatry

## 2017-02-13 MED ORDER — SERTRALINE HCL 100 MG PO TABS: 150.0000 mg | ORAL_TABLET | Freq: Every day | ORAL | 0 refills | Status: DC

## 2017-02-13 NOTE — Telephone Encounter (Signed)
Ordered. (no more refill to be made without evaluation.)

## 2017-02-13 NOTE — Telephone Encounter (Signed)
Called pt and informed him that provider sent medication to his pharmacy. Pt verbalized understanding.

## 2017-02-18 NOTE — Progress Notes (Signed)
BH MD/PA/NP OP Progress Note  02/20/2017 5:04 PM Travis Tapia  MRN:  [id number]  Chief Complaint:  HPI:  Patient presents for follow up appointment for depression. He states that he closed the house to move to a bigger place. Although there was a struggled in this process, he was ale to manage things. He feels irritable at times against his children, although he tries to "let them be a child." He talks about his wife who survived from leukemia. He talks about the time she was very irritable, approaching anniversary of death of her grandparent. It is difficult for him not to take it personally, although he tries to see if she becomes more often. He hopes to try couple counseling, although she feels ambivalent about it. He has insomnia at times. He feels fatigue and has low energy. He denies SI. He feels anxious and had panic attacks a few times. He continued sertraline 100 mg daily without recognizing the dose.   Visit Diagnosis:    ICD-10-CM   1. Major depressive disorder, single episode, moderate (HCC) F32.1     Past Psychiatric History:  I have reviewed the patient's psychiatry history in detail and updated the patient record. Outpatient: seasonal affective disorder Psychiatry admission: denies Previous suicide attempt: denies Past trials of medication: some antidepressant (do not remember the name)  Past Medical History:  Past Medical History:  Diagnosis Date  . Arachnoid cyst    Cerebellum - followed at [facility]  . CAD (coronary artery disease)    DES to RCA 06/2014, DES to LAD 06/2014  . Depression   . 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 APPENDECTOMY    . HERNIA REPAIR      Family Psychiatric History:  I have reviewed the patient's family history in detail and updated the patient record.  Family History:  Family History  Problem Relation Age of Onset  . Heart disease Father        CABG   . Stroke Father   . Hyperlipidemia Father   . Depression Father   . Hypertension Mother   . Depression Paternal Grandmother     Social History:  Social History   Social History  . Marital status: Married    Spouse name: N/A  . Number of children: N/A  . Years of education: N/A   Social History Main Topics  . Smoking status: Former Smoker    Quit date: 06/04/2006  . Smokeless tobacco: Current User    Types: Chew  . Alcohol use 1.2 oz/week    2 Cans of beer per week     Comment: beer twice a week.02-23-2016 1-2 beers per week  . Drug use: No     Comment: 02-23-2016 per pt no  . Sexual activity: Yes   Other Topics Concern  . None   Social History Narrative  . None    Allergies: No Known Allergies  Metabolic Disorder Labs: Lab Results  Component Value Date   HGBA1C 5.4 01/20/2016   MPG 108 01/20/2016   No results found for: PROLACTIN No results found for: CHOL, TRIG, HDL, CHOLHDL, VLDL, LDLCALC No results found for: TSH  Therapeutic Level Labs: No results found for: LITHIUM No results found for: VALPROATE No components found for:  CBMZ  Current Medications: Current Outpatient Prescriptions  Medication Sig Dispense Refill  . aspirin 81 MG tablet Take 81 mg by mouth daily.    Marland Kitchen  atenolol (TENORMIN) 25 MG tablet TAKE 1/2 TABLET BY MOUTH EVERY DAY 45 tablet 3  . atorvastatin (LIPITOR) 80 MG tablet TAKE 1 TABLET (80 MG TOTAL) BY MOUTH AT BEDTIME. 90 tablet 1  . Cholecalciferol (CVS D3) 2000 UNITS CAPS Take 1 capsule by mouth daily.     . clopidogrel (PLAVIX) 75 MG tablet TAKE 1 TABLET (75 MG TOTAL) BY MOUTH EVERY MORNING. 90 tablet 3  . nitroGLYCERIN (NITROSTAT) 0.4 MG SL tablet PLACE 1 TABLET (0.4 MG TOTAL) UNDER THE TONGUE EVERY 5 (FIVE) MINUTES AS NEEDED FOR CHEST PAIN. 25 tablet 3  . ranitidine (ZANTAC) 150 MG tablet Take 150 mg by mouth 2 (two) times daily.    Marland Kitchen SALINE NASAL MIST NA Place 1-2 sprays into the nose daily as needed (for congestion).    .  sertraline (ZOLOFT) 100 MG tablet Take 1.5 tablets (150 mg total) by mouth daily. 45 tablet 1   No current facility-administered medications for this visit.      Musculoskeletal: Strength & Muscle Tone: within normal limits Gait & Station: normal Patient leans: N/A  Psychiatric Specialty Exam: Review of Systems  Psychiatric/Behavioral: Positive for depression. Negative for hallucinations, substance abuse and suicidal ideas. The patient is nervous/anxious and has insomnia.   All other systems reviewed and are negative.   Blood pressure (!) 135/93, pulse 89, height  (1.854 m), weight 226 lb (102.5 kg).Body mass index is 29.82 kg/m.  General Appearance: Fairly Groomed  Eye Contact:  Good  Speech:  Clear and Coherent  Volume:  Normal  Mood:  Anxious  Affect:  Appropriate, Congruent and down, less restricted  Thought Process:  Coherent and Goal Directed  Orientation:  Full (Time, Place, and Person)  Thought Content: Logical Perceptions: denies AH/VH  Suicidal Thoughts:  No  Homicidal Thoughts:  No  Memory:  Immediate;   Good Recent;   Good Remote;   Good  Judgement:  Good  Insight:  Fair  Psychomotor Activity:  Normal  Concentration:  Concentration: Good and Attention Span: Good  Recall:  Good  Fund of Knowledge: Good  Language: Good  Akathisia:  No  Handed:  Ambidextrous  AIMS (if indicated): not done  Assets:  Communication Skills Desire for Improvement  ADL's:  Intact  Cognition: WNL  Sleep:  Poor   Screenings:   Assessment and Plan:  Travis Tapia is a 51 y.o. year old male with a history of depression, CAD, who presents for follow up appointment for Major depressive disorder, single episode, moderate (HCC)  # MDD, moderate, recurrent without psychotic features Patient continues to endorse fatigue, anxiety and irritability in the setting of marital discordance and closing the house. Will uptitrate sertraline (he missed to do it) to optimize its effect for  depression. Explored the way he can have effective communication with his wife. Dicussed cognitive defusion. Although therapy is strongly encouraged, he is not interested in this option. Will continue to discus as needed.   Plans  1. Increase sertraline 150 mg at night 2. Return to clinic in two months for 30 mins  The patient demonstrates the following risk factors for suicide: Chronic risk factorsfor suicide include psychiatric disorder /depression,medical illness/CAD. Acute risk factorsfor suicide include none.Protective factorsfor this patient include positive social support, positive therapeutic relationship, responsibility to others (children, family), coping skills, hope for the future. Considering these factors, the overall suicide risk at this point appears to be low.  The duration of this appointment visit was 30 minutes of face-to-face time  with the patient.  Greater than 50% of this time was spent in counseling, explanation of  diagnosis, planning of further management, and coordination of care.  Neysa Hotter, MD 02/20/2017, 5:04 PM

## 2017-02-20 ENCOUNTER — Encounter (HOSPITAL_COMMUNITY): Payer: Self-pay | Admitting: Psychiatry

## 2017-02-20 ENCOUNTER — Ambulatory Visit (INDEPENDENT_AMBULATORY_CARE_PROVIDER_SITE_OTHER): Payer: BLUE CROSS/BLUE SHIELD | Admitting: Psychiatry

## 2017-02-20 VITALS — BP 135/93 | HR 89 | Ht 73.0 in | Wt 226.0 lb

## 2017-02-20 DIAGNOSIS — Z818 Family history of other mental and behavioral disorders: Secondary | ICD-10-CM | POA: Diagnosis not present

## 2017-02-20 DIAGNOSIS — F321 Major depressive disorder, single episode, moderate: Secondary | ICD-10-CM

## 2017-02-20 DIAGNOSIS — F17220 Nicotine dependence, chewing tobacco, uncomplicated: Secondary | ICD-10-CM | POA: Diagnosis not present

## 2017-02-20 DIAGNOSIS — Z79899 Other long term (current) drug therapy: Secondary | ICD-10-CM

## 2017-02-20 DIAGNOSIS — G4700 Insomnia, unspecified: Secondary | ICD-10-CM

## 2017-02-20 MED ORDER — SERTRALINE HCL 100 MG PO TABS: 150.0000 mg | ORAL_TABLET | Freq: Every day | ORAL | 1 refills | Status: DC

## 2017-02-20 NOTE — Patient Instructions (Signed)
1. Increase sertraline 150 mg at night 2. Return to clinic in two months for 30 mins

## 2017-03-18 ENCOUNTER — Other Ambulatory Visit: Payer: Self-pay | Admitting: Cardiology

## 2017-09-17 ENCOUNTER — Telehealth: Payer: Self-pay

## 2017-09-17 NOTE — Telephone Encounter (Signed)
Patient is scheduled for carpal tunnel surgery on Friday 4/19. Dr. Abran DukeKrome is suggesting patient stay off plavix for 10 days prior to surgery if possible. Just received phone call today notifying us of surgery. Dr. Abran DukeKrome would like Dr. Orson GearMcDowells recommendation regarding how long patient should be off plavix prior to surgery.  Dondra SpryGail (nurse) [phone]

## 2017-09-18 NOTE — Telephone Encounter (Signed)
Patient notified.  Note faxed to [facility] at [phone].

## 2017-09-18 NOTE — Telephone Encounter (Signed)
Generally 7 days off Plavix should be sufficient.

## 2017-09-18 NOTE — Telephone Encounter (Signed)
May hold aspirin during this time as well.

## 2017-09-18 NOTE — Telephone Encounter (Signed)
Noted.  [facility].  Stated [place] in [place] office today & would like faxed to:  [phone].

## 2017-09-18 NOTE — Telephone Encounter (Signed)
Travis SpryGail from [facility] calling back - want to know if patient should hold his ASA as well.

## 2017-09-18 NOTE — Telephone Encounter (Signed)
Travis Tapia returned telephone call.

## 2017-12-16 ENCOUNTER — Telehealth: Payer: Self-pay | Admitting: Cardiology

## 2017-12-16 NOTE — Telephone Encounter (Signed)
Patient requesting appointment today for c/o fatigue, sob with walking up steps or hills, intermittent dizzy spells for 2 weeks. Also c/o minimal chest discomfort on left side when inhaling lasting 1-2 seconds. Has not used nitroglycerin. No c/o n/v, swelling. Patient was given first available appointment for 12/18/17 @2 :00 pm with Ronie Spiesayna Dunn at [place] office. Advised patient that if his symptoms got worse, to go to the ED for an evaluation. Verbalized understanding of plan.

## 2017-12-16 NOTE — Telephone Encounter (Signed)
SOB with exertion  Stated that he was this way right before his heart attack. Concerned he maybe experiencing again  Stated he can not walk with out  Giving out  Sharp pains on left side under his ribs.  Not experiencing pain today.

## 2017-12-17 ENCOUNTER — Encounter: Payer: Self-pay | Admitting: Physician Assistant

## 2017-12-17 NOTE — Progress Notes (Signed)
Cardiology Office Note    Date:  12/18/2017  ID:  Roque Lias, DOB 01-Aug-1965, MRN [id number] PCP:  Majel Homer, MD  Cardiologist:  Nona Dell, MD  Chief Complaint: shortness of breath, CP, fatigue  History of Present Illness:  Travis Tapia is a 52 y.o. male with history of CAD (inferoposterior STEMI 06/2014 s/p DES to RCA with staged DES to LAD at that time, EF 55-60% at time of MI), depression, cerebellar arachnoid cyst followed at [facility], hyperlipidemia, tobacco use, overweight, sinus bradycardia who presents for evaluation of SOB and fatigue. Prior records reviewed including cath 06/2014 at OSH, which showed otherwise a 30% prox Cx.  2D echo 06/2014 showed EF 55-60%, grade 1 DD, mild LVH. Nuclear stress test 01/2016 showed inferior attn artifact but no significant ischemia, normal LV wall motion, EF 51%. Last labs in 2017 showed normal CBC Hgb 14.4, K 3.3, glucose 101, Cr 0.73, A1c 5.4.  He presents for eval of DOE and episodic chest discomfort. He reports for the past few months he has noticed progressive DOE with higher levels of activity like walking up a hill. It's become more noticeable the last few weeks. He does not regularly exercise. He also has noticed rare instances of chest discomfort, somewhat vague in nature, sometimes focal in a spot up under his rib, sometimes tightness - many different forms. It usually occurs in instances of increased emotional stress such as work. It is relieved with calming down. He is not really getting CP with exertion. No LEE, orthopnea, PND, syncope. Does get a little lightheaded when standing up quickly. He continues to chew tobacco. He is very concerned about symptoms as they remind him of the few months leading up to his MI when he had a stress test and everything looked fine, but then he went on to have his MI. No CP today. No SOB at rest.   Past Medical History:  Diagnosis Date  . Arachnoid cyst    Cerebellum - followed at [facility]  . CAD  (coronary artery disease)    a. inferoposterior STEMI 06/2014 s/p DES to RCA with staged DES to LAD at that time, EF 55-60% at time of MI. b. EF 51% by nonischemic nuc 01/2016.  Marland Kitchen Depression   . History of inferior wall myocardial infarction    January 2016  . Hyperlipidemia   . Overweight   . Sinus bradycardia    a. not on BB due to this.  . Tobacco use     Past Surgical History:  Procedure Laterality Date  . APPENDECTOMY    . HERNIA REPAIR      Current Medications: Current Meds  Medication Sig  . aspirin 81 MG tablet Take 81 mg by mouth daily.  Marland Kitchen atenolol (TENORMIN) 25 MG tablet TAKE 1/2 TABLET BY MOUTH EVERY DAY  . atorvastatin (LIPITOR) 80 MG tablet TAKE 1 TABLET (80 MG TOTAL) BY MOUTH AT BEDTIME.  Marland Kitchen Cholecalciferol ([facility] D3) 2000 UNITS CAPS Take 1 capsule by mouth daily.   . clopidogrel (PLAVIX) 75 MG tablet TAKE 1 TABLET (75 MG TOTAL) BY MOUTH EVERY MORNING.  . nitroGLYCERIN (NITROSTAT) 0.4 MG SL tablet PLACE 1 TABLET (0.4 MG TOTAL) UNDER THE TONGUE EVERY 5 (FIVE) MINUTES AS NEEDED FOR CHEST PAIN.  . ranitidine (ZANTAC) 150 MG tablet Take 150 mg by mouth 2 (two) times daily.  Marland Kitchen SALINE NASAL MIST NA Place 1-2 sprays into the nose daily as needed (for congestion).    Allergies:   Patient  has no known allergies.   Social History   Socioeconomic History  . Marital status: Married    Spouse name: Not on file  . Number of children: Not on file  . Years of education: Not on file  . Highest education level: Not on file  Occupational History  . Not on file  Social Needs  . Financial resource strain: Not on file  . Food insecurity:    Worry: Not on file    Inability: Not on file  . Transportation needs:    Medical: Not on file    Non-medical: Not on file  Tobacco Use  . Smoking status: Former Smoker    Last attempt to quit: 06/04/2006    Years since quitting: 11.5  . Smokeless tobacco: Current User    Types: Chew  Substance and Sexual Activity  . Alcohol use: Yes     Alcohol/week: 1.2 oz    Types: 2 Cans of beer per week    Comment: beer twice a week.02-23-2016 1-2 beers per week  . Drug use: No    Comment: 02-23-2016 per pt no  . Sexual activity: Yes  Lifestyle  . Physical activity:    Days per week: Not on file    Minutes per session: Not on file  . Stress: Not on file  Relationships  . Social connections:    Talks on phone: Not on file    Gets together: Not on file    Attends religious service: Not on file    Active member of club or organization: Not on file    Attends meetings of clubs or organizations: Not on file    Relationship status: Not on file  Other Topics Concern  . Not on file  Social History Narrative  . Not on file     Family History:  The patient's family history includes Depression in his father and paternal grandmother; Heart disease in his father; Hyperlipidemia in his father; Hypertension in his mother; Stroke in his father.  ROS:   Please see the history of present illness. Otherwise, review of systems is positive for fungus on toenails - asked him to review with PCP. + intermittent vertigo, takes meclizine . All other systems are reviewed and otherwise negative.    PHYSICAL EXAM:   VS:  BP 114/68   Pulse 77   Ht 6\' 2"  (1.88 m)   Wt 229 lb (103.9 kg)   SpO2 97%   BMI 29.40 kg/m   BMI: Body mass index is 29.4 kg/m. GEN: Well nourished, well developed WM in no acute distress HEENT: normocephalic, atraumatic Neck: no JVD, carotid bruits, or masses Cardiac: RRR; no murmurs, rubs, or gallops, no edema  Respiratory:  clear to auscultation bilaterally, normal work of breathing GI: soft, nontender, nondistended, + BS MS: no deformity or atrophy Skin: warm and dry, no rash Neuro:  Alert and Oriented x 3, Strength and sensation are intact, follows commands Psych: euthymic mood, full affect  Wt Readings from Last 3 Encounters:  12/18/17 229 lb (103.9 kg)  01/03/17 230 lb (104.3 kg)  01/25/16 223 lb (101.2 kg)        Studies/Labs Reviewed:   EKG:  EKG was ordered today and personally reviewed by me and demonstrates NSR 80bpm, no acute STtT changes. Nonspecific TW changes III.  Recent Labs: No results found for requested labs within last 8760 hours.   Lipid Panel No results found for: CHOL, TRIG, HDL, CHOLHDL, VLDL, LDLCALC, LDLDIRECT  Additional studies/ records  that were reviewed today include: Summarized above.   ASSESSMENT & PLAN:   1. Shortness of breath, fatigue, chest discomfort - mixed typical/atypical features. EKG is unrevealing. I discussed options for eval with patient today including exercise nuclear stress test versus cardiac catheterization. He remains concerned about a false negative stress test because he had a reassuring test just a few months before his MI. He is concerned about recurrence of blockage. He would feel most comfortable with definitive eval. I reviewed with Dr. Wyline MoodBranch. This is reasonable given his prior history. Will arrange left heart catheterization. Risks and benefits of cardiac catheterization have been discussed with the patient.  These include bleeding, infection, kidney damage, stroke, heart attack, death.  The patient understands these risks and is willing to proceed. ER precautions reviewed. Continue present meds and check screening labs. 2. CAD - as above. Continue ASA, Plavix, BB, statin. 3. Hyperlipidemia - continue statin. Check LFTs, lipids today with labs (I am OK with nonfasting labs given that he is not diabetic). 4. Tobacco use - he continues to chew tobacco. Complete cessation advised.  Disposition: F/u with Dr. Diona BrownerMcDowell as scheduled next month as post-cath f/u.   Medication Adjustments/Labs and Tests Ordered: Current medicines are reviewed at length with the patient today.  Concerns regarding medicines are outlined above. Medication changes, Labs and Tests ordered today are summarized above and listed in the Patient Instructions accessible in  Encounters.   Signed, Laurann Montanaayna N Dunn, PA-C  12/18/2017 2:42 PM    [facility] [place] Location in [facility] [address] [place], [place] [address] Ph: [phone]; Fax [phone]

## 2017-12-17 NOTE — H&P (View-Only) (Signed)
 Cardiology Office Note    Date:  12/18/2017  ID:  Travis Tapia, DOB 05/13/1966, MRN [id number] PCP:  Buchanan, Linda, MD  Cardiologist:  Samuel McDowell, MD  Chief Complaint: shortness of breath, CP, fatigue  History of Present Illness:  Travis Tapia is a 52 y.o. male with history of CAD (inferoposterior STEMI 06/2014 s/p DES to RCA with staged DES to LAD at that time, EF 55-60% at time of MI), depression, cerebellar arachnoid cyst followed at [facility], hyperlipidemia, tobacco use, overweight, sinus bradycardia who presents for evaluation of SOB and fatigue. Prior records reviewed including cath 06/2014 at OSH, which showed otherwise a 30% prox Cx.  2D echo 06/2014 showed EF 55-60%, grade 1 DD, mild LVH. Nuclear stress test 01/2016 showed inferior attn artifact but no significant ischemia, normal LV wall motion, EF 51%. Last labs in 2017 showed normal CBC Hgb 14.4, K 3.3, glucose 101, Cr 0.73, A1c 5.4.  He presents for eval of DOE and episodic chest discomfort. He reports for the past few months he has noticed progressive DOE with higher levels of activity like walking up a hill. It's become more noticeable the last few weeks. He does not regularly exercise. He also has noticed rare instances of chest discomfort, somewhat vague in nature, sometimes focal in a spot up under his rib, sometimes tightness - many different forms. It usually occurs in instances of increased emotional stress such as work. It is relieved with calming down. He is not really getting CP with exertion. No LEE, orthopnea, PND, syncope. Does get a little lightheaded when standing up quickly. He continues to chew tobacco. He is very concerned about symptoms as they remind him of the few months leading up to his MI when he had a stress test and everything looked fine, but then he went on to have his MI. No CP today. No SOB at rest.   Past Medical History:  Diagnosis Date  . Arachnoid cyst    Cerebellum - followed at [facility]  . CAD  (coronary artery disease)    a. inferoposterior STEMI 06/2014 s/p DES to RCA with staged DES to LAD at that time, EF 55-60% at time of MI. b. EF 51% by nonischemic nuc 01/2016.  . Depression   . History of inferior wall myocardial infarction    January 2016  . Hyperlipidemia   . Overweight   . Sinus bradycardia    a. not on BB due to this.  . Tobacco use     Past Surgical History:  Procedure Laterality Date  . APPENDECTOMY    . HERNIA REPAIR      Current Medications: Current Meds  Medication Sig  . aspirin 81 MG tablet Take 81 mg by mouth daily.  . atenolol (TENORMIN) 25 MG tablet TAKE 1/2 TABLET BY MOUTH EVERY DAY  . atorvastatin (LIPITOR) 80 MG tablet TAKE 1 TABLET (80 MG TOTAL) BY MOUTH AT BEDTIME.  . Cholecalciferol ([facility]S D3) 2000 UNITS CAPS Take 1 capsule by mouth daily.   . clopidogrel (PLAVIX) 75 MG tablet TAKE 1 TABLET (75 MG TOTAL) BY MOUTH EVERY MORNING.  . nitroGLYCERIN (NITROSTAT) 0.4 MG SL tablet PLACE 1 TABLET (0.4 MG TOTAL) UNDER THE TONGUE EVERY 5 (FIVE) MINUTES AS NEEDED FOR CHEST PAIN.  . ranitidine (ZANTAC) 150 MG tablet Take 150 mg by mouth 2 (two) times daily.  . SALINE NASAL MIST NA Place 1-2 sprays into the nose daily as needed (for congestion).    Allergies:   Patient   has no known allergies.   Social History   Socioeconomic History  . Marital status: Married    Spouse name: Not on file  . Number of children: Not on file  . Years of education: Not on file  . Highest education level: Not on file  Occupational History  . Not on file  Social Needs  . Financial resource strain: Not on file  . Food insecurity:    Worry: Not on file    Inability: Not on file  . Transportation needs:    Medical: Not on file    Non-medical: Not on file  Tobacco Use  . Smoking status: Former Smoker    Last attempt to quit: 06/04/2006    Years since quitting: 11.5  . Smokeless tobacco: Current User    Types: Chew  Substance and Sexual Activity  . Alcohol use: Yes     Alcohol/week: 1.2 oz    Types: 2 Cans of beer per week    Comment: beer twice a week.02-23-2016 1-2 beers per week  . Drug use: No    Comment: 02-23-2016 per pt no  . Sexual activity: Yes  Lifestyle  . Physical activity:    Days per week: Not on file    Minutes per session: Not on file  . Stress: Not on file  Relationships  . Social connections:    Talks on phone: Not on file    Gets together: Not on file    Attends religious service: Not on file    Active member of club or organization: Not on file    Attends meetings of clubs or organizations: Not on file    Relationship status: Not on file  Other Topics Concern  . Not on file  Social History Narrative  . Not on file     Family History:  The patient's family history includes Depression in his father and paternal grandmother; Heart disease in his father; Hyperlipidemia in his father; Hypertension in his mother; Stroke in his father.  ROS:   Please see the history of present illness. Otherwise, review of systems is positive for fungus on toenails - asked him to review with PCP. + intermittent vertigo, takes meclizine . All other systems are reviewed and otherwise negative.    PHYSICAL EXAM:   VS:  BP 114/68   Pulse 77   Ht 6' 2" (1.88 m)   Wt 229 lb (103.9 kg)   SpO2 97%   BMI 29.40 kg/m   BMI: Body mass index is 29.4 kg/m. GEN: Well nourished, well developed WM in no acute distress HEENT: normocephalic, atraumatic Neck: no JVD, carotid bruits, or masses Cardiac: RRR; no murmurs, rubs, or gallops, no edema  Respiratory:  clear to auscultation bilaterally, normal work of breathing GI: soft, nontender, nondistended, + BS MS: no deformity or atrophy Skin: warm and dry, no rash Neuro:  Alert and Oriented x 3, Strength and sensation are intact, follows commands Psych: euthymic mood, full affect  Wt Readings from Last 3 Encounters:  12/18/17 229 lb (103.9 kg)  01/03/17 230 lb (104.3 kg)  01/25/16 223 lb (101.2 kg)        Studies/Labs Reviewed:   EKG:  EKG was ordered today and personally reviewed by me and demonstrates NSR 80bpm, no acute STtT changes. Nonspecific TW changes III.  Recent Labs: No results found for requested labs within last 8760 hours.   Lipid Panel No results found for: CHOL, TRIG, HDL, CHOLHDL, VLDL, LDLCALC, LDLDIRECT  Additional studies/ records   that were reviewed today include: Summarized above.   ASSESSMENT & PLAN:   1. Shortness of breath, fatigue, chest discomfort - mixed typical/atypical features. EKG is unrevealing. I discussed options for eval with patient today including exercise nuclear stress test versus cardiac catheterization. He remains concerned about a false negative stress test because he had a reassuring test just a few months before his MI. He is concerned about recurrence of blockage. He would feel most comfortable with definitive eval. I reviewed with Dr. Branch. This is reasonable given his prior history. Will arrange left heart catheterization. Risks and benefits of cardiac catheterization have been discussed with the patient.  These include bleeding, infection, kidney damage, stroke, heart attack, death.  The patient understands these risks and is willing to proceed. ER precautions reviewed. Continue present meds and check screening labs. 2. CAD - as above. Continue ASA, Plavix, BB, statin. 3. Hyperlipidemia - continue statin. Check LFTs, lipids today with labs (I am OK with nonfasting labs given that he is not diabetic). 4. Tobacco use - he continues to chew tobacco. Complete cessation advised.  Disposition: F/u with Dr. McDowell as scheduled next month as post-cath f/u.   Medication Adjustments/Labs and Tests Ordered: Current medicines are reviewed at length with the patient today.  Concerns regarding medicines are outlined above. Medication changes, Labs and Tests ordered today are summarized above and listed in the Patient Instructions accessible in  Encounters.   Signed, Elease Swarm N Kreston Ahrendt, PA-C  12/18/2017 2:42 PM    [facility] - [place] Location in [facility] [address] [place], [place] [address] Ph: [phone]; Fax [phone] 

## 2017-12-18 ENCOUNTER — Ambulatory Visit (INDEPENDENT_AMBULATORY_CARE_PROVIDER_SITE_OTHER): Payer: BLUE CROSS/BLUE SHIELD | Admitting: Physician Assistant

## 2017-12-18 ENCOUNTER — Encounter: Payer: Self-pay | Admitting: Physician Assistant

## 2017-12-18 ENCOUNTER — Other Ambulatory Visit (HOSPITAL_COMMUNITY)
Admission: RE | Admit: 2017-12-18 | Discharge: 2017-12-18 | Disposition: A | Discharge status: Home / Self Care | Payer: BLUE CROSS/BLUE SHIELD | Source: Ambulatory Visit | Attending: Physician Assistant | Admitting: Physician Assistant

## 2017-12-18 VITALS — BP 114/68 | HR 77 | Ht 74.0 in | Wt 229.0 lb

## 2017-12-18 DIAGNOSIS — R0602 Shortness of breath: Secondary | ICD-10-CM | POA: Insufficient documentation

## 2017-12-18 DIAGNOSIS — E785 Hyperlipidemia, unspecified: Secondary | ICD-10-CM

## 2017-12-18 DIAGNOSIS — I2510 Atherosclerotic heart disease of native coronary artery without angina pectoris: Secondary | ICD-10-CM | POA: Diagnosis present

## 2017-12-18 DIAGNOSIS — Z720 Tobacco use: Secondary | ICD-10-CM | POA: Diagnosis present

## 2017-12-18 DIAGNOSIS — R5383 Other fatigue: Secondary | ICD-10-CM | POA: Insufficient documentation

## 2017-12-18 LAB — CBC WITH DIFFERENTIAL/PLATELET
BASOS ABS: 0 10*3/uL (ref 0.0–0.1)
Basophils Relative: 0 %
Eosinophils Absolute: 0.2 10*3/uL (ref 0.0–0.7)
Eosinophils Relative: 2 %
HCT: 43.5 % (ref 39.0–52.0)
Hemoglobin: 15 g/dL (ref 13.0–17.0)
LYMPHS ABS: 2.3 10*3/uL (ref 0.7–4.0)
LYMPHS PCT: 34 %
MCH: 31.1 pg (ref 26.0–34.0)
MCHC: 34.5 g/dL (ref 30.0–36.0)
MCV: 90.2 fL (ref 78.0–100.0)
Monocytes Absolute: 0.5 10*3/uL (ref 0.1–1.0)
Monocytes Relative: 8 %
NEUTROS PCT: 56 %
Neutro Abs: 3.9 10*3/uL (ref 1.7–7.7)
Platelets: 258 10*3/uL (ref 150–400)
RBC: 4.82 MIL/uL (ref 4.22–5.81)
RDW: 12.5 % (ref 11.5–15.5)
WBC: 6.9 10*3/uL (ref 4.0–10.5)

## 2017-12-18 LAB — LIPID PANEL
CHOLESTEROL: 99 mg/dL (ref 0–200)
HDL: 30 mg/dL — AB (ref >40)
LDL Cholesterol: 44 mg/dL (ref 0–99)
Total CHOL/HDL Ratio: 3.3 RATIO
Triglycerides: 127 mg/dL (ref <150)
VLDL: 25 mg/dL (ref 0–40)

## 2017-12-18 LAB — COMPREHENSIVE METABOLIC PANEL
ALBUMIN: 4.4 g/dL (ref 3.5–5.0)
ALT: 70 U/L — ABNORMAL HIGH (ref 0–44)
ANION GAP: 9 (ref 5–15)
AST: 51 U/L — AB (ref 15–41)
Alkaline Phosphatase: 73 U/L (ref 38–126)
BILIRUBIN TOTAL: 1 mg/dL (ref 0.3–1.2)
BUN: 16 mg/dL (ref 6–20)
CHLORIDE: 109 mmol/L (ref 98–111)
CO2: 24 mmol/L (ref 22–32)
Calcium: 9.2 mg/dL (ref 8.9–10.3)
Creatinine, Ser: 0.8 mg/dL (ref 0.61–1.24)
Glucose, Bld: 110 mg/dL — ABNORMAL HIGH (ref 70–99)
POTASSIUM: 3.8 mmol/L (ref 3.5–5.1)
Sodium: 142 mmol/L (ref 135–145)
TOTAL PROTEIN: 7.6 g/dL (ref 6.5–8.1)

## 2017-12-18 LAB — BRAIN NATRIURETIC PEPTIDE: B NATRIURETIC PEPTIDE 5: 16 pg/mL (ref 0.0–100.0)

## 2017-12-18 LAB — TSH: TSH: 1.505 u[IU]/mL (ref 0.350–4.500)

## 2017-12-18 NOTE — Patient Instructions (Signed)
Medication Instructions:  Your physician recommends that you continue on your current medications as directed. Please refer to the Current Medication list given to you today.   Labwork: Your physician recommends that you return for lab work today.   Testing/Procedures: Your physician has requested that you have a cardiac catheterization. Cardiac catheterization is used to diagnose and/or treat various heart conditions. Doctors may recommend this procedure for a number of different reasons. The most common reason is to evaluate chest pain. Chest pain can be a symptom of coronary artery disease (CAD), and cardiac catheterization can show whether plaque is narrowing or blocking your heart's arteries. This procedure is also used to evaluate the valves, as well as measure the blood flow and oxygen levels in different parts of your heart. For further information please visit [email or web address] Please follow instruction sheet, as given.   Follow-Up: Your physician recommends that you schedule a follow-up appointment As Planned    Any Other Special Instructions Will Be Listed Below (If Applicable).     If you need a refill on your cardiac medications before your next appointment, please call your pharmacy.  Thank you for choosing [facility]!    [facility] [address] [place] [address] Dept: [phone] Loc: [phone]  Roque LiasReuben K Obrien  12/18/2017  You are scheduled for a Cardiac Catheterization on Thursday, July 18 with Dr. Cristal Deerhristopher End.  1. Please arrive [facility] the [facility] at [facility]: [address] [place], [place] [address] at 5:30 AM (two hours before your procedure to ensure your preparation). Free valet parking service is available.   Special note: Every effort is made to have your procedure done on time. Please understand that emergencies sometimes delay  scheduled procedures.  2. Diet: Do not eat or drink anything after midnight prior to your procedure except sips of water to take medications.  3. Labs: None needed.  4. Medication instructions in preparation for your procedure:  On the morning of your procedure, take your Aspirin and any morning medicines NOT listed above.  You may use sips of water.  5. Plan for one night stay--bring personal belongings. 6. Bring a current list of your medications and current insurance cards. 7. You MUST have a responsible person to drive you home. 8. Someone MUST be with you the first 24 hours after you arrive home or your discharge will be delayed. 9. Please wear clothes that are easy to get on and off and wear slip-on shoes.  Thank you for allowing us to care for you!   -- [facility]

## 2017-12-19 ENCOUNTER — Encounter: Payer: Self-pay | Admitting: *Deleted

## 2017-12-19 ENCOUNTER — Telehealth: Payer: Self-pay | Admitting: *Deleted

## 2017-12-19 ENCOUNTER — Telehealth: Payer: Self-pay | Admitting: Physician Assistant

## 2017-12-19 DIAGNOSIS — E785 Hyperlipidemia, unspecified: Secondary | ICD-10-CM

## 2017-12-19 DIAGNOSIS — Z79899 Other long term (current) drug therapy: Secondary | ICD-10-CM

## 2017-12-19 MED ORDER — NITROGLYCERIN 0.4 MG SL SUBL: 0.4000 mg | SUBLINGUAL_TABLET | SUBLINGUAL | 3 refills | Status: AC | PRN

## 2017-12-19 NOTE — Telephone Encounter (Signed)
-----   Message from Laurann Montanaayna N Dunn, [place]-C sent at 12/18/2017  5:08 PM EDT ----- Please let patient know pre-cath labs OK except liver function very slightly abnormal. Do not have any values between now and 2017 to compare to so I would suggest rechecking in 1-2 weeks, and please contact PCP's office to send us their most recent values too. Avoid Tylenol/ETOH/NSAIDS for now. Dayna Dunn PA-C

## 2017-12-19 NOTE — Telephone Encounter (Signed)
Spoke with [facility] who states that NTG has not been denied. Pharmacy states that they are currently working to fill the Rx and will call the pt when ready. Called patient. No answer. Msg left.

## 2017-12-19 NOTE — Telephone Encounter (Signed)
Please call patient regarding NTG being denied at pharmacy  tg

## 2017-12-19 NOTE — Telephone Encounter (Signed)
Pt contacted pre-catheterization scheduled at [facility] for: Friday December 20, 2017 7:30 AM Verified arrival [facility] and place: [facility] Main [facility] A at: 5:30 AM  No solid food after midnight prior to cath, clear liquids until 5 AM day of procedure. Verified no known allergies. Verified no diabetes medications.  AM meds can be  taken pre-cath with sip of water including: ASA 81 mg Clopidogrel 75 mg  Confirmed patient has responsible person to drive home post procedure and for 24 hours after you arrive home: yes

## 2017-12-20 ENCOUNTER — Other Ambulatory Visit: Payer: Self-pay

## 2017-12-20 ENCOUNTER — Encounter (HOSPITAL_COMMUNITY): Payer: Self-pay | Admitting: Internal Medicine

## 2017-12-20 ENCOUNTER — Ambulatory Visit (HOSPITAL_COMMUNITY)
Admission: RE | Admit: 2017-12-20 | Discharge: 2017-12-20 | Disposition: A | Discharge status: Home / Self Care | Payer: BLUE CROSS/BLUE SHIELD | Source: Ambulatory Visit | Attending: Internal Medicine | Admitting: Internal Medicine

## 2017-12-20 ENCOUNTER — Encounter (HOSPITAL_COMMUNITY)
Admission: RE | Disposition: A | Discharge status: Home / Self Care | Payer: Self-pay | Source: Ambulatory Visit | Attending: Internal Medicine

## 2017-12-20 DIAGNOSIS — Z7902 Long term (current) use of antithrombotics/antiplatelets: Secondary | ICD-10-CM | POA: Diagnosis not present

## 2017-12-20 DIAGNOSIS — R0602 Shortness of breath: Secondary | ICD-10-CM

## 2017-12-20 DIAGNOSIS — Z720 Tobacco use: Secondary | ICD-10-CM | POA: Insufficient documentation

## 2017-12-20 DIAGNOSIS — E663 Overweight: Secondary | ICD-10-CM | POA: Insufficient documentation

## 2017-12-20 DIAGNOSIS — I25110 Atherosclerotic heart disease of native coronary artery with unstable angina pectoris: Secondary | ICD-10-CM | POA: Insufficient documentation

## 2017-12-20 DIAGNOSIS — I252 Old myocardial infarction: Secondary | ICD-10-CM | POA: Diagnosis not present

## 2017-12-20 DIAGNOSIS — Z955 Presence of coronary angioplasty implant and graft: Secondary | ICD-10-CM | POA: Diagnosis not present

## 2017-12-20 DIAGNOSIS — Z8249 Family history of ischemic heart disease and other diseases of the circulatory system: Secondary | ICD-10-CM | POA: Diagnosis not present

## 2017-12-20 DIAGNOSIS — I25119 Atherosclerotic heart disease of native coronary artery with unspecified angina pectoris: Secondary | ICD-10-CM | POA: Diagnosis present

## 2017-12-20 DIAGNOSIS — E785 Hyperlipidemia, unspecified: Secondary | ICD-10-CM | POA: Diagnosis not present

## 2017-12-20 DIAGNOSIS — Z7982 Long term (current) use of aspirin: Secondary | ICD-10-CM | POA: Insufficient documentation

## 2017-12-20 DIAGNOSIS — I200 Unstable angina: Secondary | ICD-10-CM | POA: Diagnosis present

## 2017-12-20 DIAGNOSIS — F329 Major depressive disorder, single episode, unspecified: Secondary | ICD-10-CM | POA: Insufficient documentation

## 2017-12-20 DIAGNOSIS — Z6829 Body mass index (BMI) 29.0-29.9, adult: Secondary | ICD-10-CM | POA: Insufficient documentation

## 2017-12-20 HISTORY — PX: LEFT HEART CATH AND CORONARY ANGIOGRAPHY: CATH118249

## 2017-12-20 HISTORY — PX: INTRAVASCULAR PRESSURE WIRE/FFR STUDY: CATH118243

## 2017-12-20 LAB — POCT ACTIVATED CLOTTING TIME
ACTIVATED CLOTTING TIME: 241 s
Activated Clotting Time: 241 seconds

## 2017-12-20 SURGERY — LEFT HEART CATH AND CORONARY ANGIOGRAPHY
Anesthesia: LOCAL

## 2017-12-20 MED ORDER — SODIUM CHLORIDE 0.9% FLUSH: 3.0000 mL | Freq: Two times a day (BID) | INTRAVENOUS | Status: DC

## 2017-12-20 MED ORDER — ONDANSETRON HCL 4 MG/2ML IJ SOLN: 4.0000 mg | Freq: Four times a day (QID) | INTRAMUSCULAR | Status: DC | PRN

## 2017-12-20 MED ORDER — ADENOSINE 12 MG/4ML IV SOLN
INTRAVENOUS | Status: AC
  Filled 2017-12-20: qty 4

## 2017-12-20 MED ORDER — HEPARIN SODIUM (PORCINE) 1000 UNIT/ML IJ SOLN
INTRAMUSCULAR | Status: AC
  Filled 2017-12-20: qty 1

## 2017-12-20 MED ORDER — SODIUM CHLORIDE 0.9 % WEIGHT BASED INFUSION: 1.0000 mL/kg/h | INTRAVENOUS | Status: DC

## 2017-12-20 MED ORDER — LIDOCAINE HCL (PF) 1 % IJ SOLN
INTRAMUSCULAR | Status: AC
  Filled 2017-12-20: qty 30

## 2017-12-20 MED ORDER — MIDAZOLAM HCL 2 MG/2ML IJ SOLN
INTRAMUSCULAR | Status: DC | PRN
  Administered 2017-12-20: 1 mg via INTRAVENOUS

## 2017-12-20 MED ORDER — SODIUM CHLORIDE 0.9% FLUSH: 3.0000 mL | INTRAVENOUS | Status: DC | PRN

## 2017-12-20 MED ORDER — HEPARIN (PORCINE) IN NACL 1000-0.9 UT/500ML-% IV SOLN
INTRAVENOUS | Status: AC
  Filled 2017-12-20: qty 1000

## 2017-12-20 MED ORDER — HEPARIN SODIUM (PORCINE) 1000 UNIT/ML IJ SOLN
INTRAMUSCULAR | Status: DC | PRN
  Administered 2017-12-20: 5000 [IU] via INTRAVENOUS
  Administered 2017-12-20: 2000 [IU] via INTRAVENOUS
  Administered 2017-12-20: 5000 [IU] via INTRAVENOUS

## 2017-12-20 MED ORDER — VERAPAMIL HCL 2.5 MG/ML IV SOLN
INTRAVENOUS | Status: DC | PRN
  Administered 2017-12-20: 10 mL via INTRA_ARTERIAL

## 2017-12-20 MED ORDER — SODIUM CHLORIDE 0.9 % IV SOLN: 250.0000 mL | INTRAVENOUS | Status: DC | PRN

## 2017-12-20 MED ORDER — MIDAZOLAM HCL 2 MG/2ML IJ SOLN
INTRAMUSCULAR | Status: AC
  Filled 2017-12-20: qty 2

## 2017-12-20 MED ORDER — ASPIRIN 81 MG PO CHEW: 81.0000 mg | CHEWABLE_TABLET | ORAL | Status: DC

## 2017-12-20 MED ORDER — FENTANYL CITRATE (PF) 100 MCG/2ML IJ SOLN
INTRAMUSCULAR | Status: DC | PRN
  Administered 2017-12-20: 50 ug via INTRAVENOUS
  Administered 2017-12-20: 25 ug via INTRAVENOUS

## 2017-12-20 MED ORDER — ACETAMINOPHEN 325 MG PO TABS
650.0000 mg | ORAL_TABLET | ORAL | Status: DC | PRN
  Administered 2017-12-20: 650 mg via ORAL
  Filled 2017-12-20: qty 2

## 2017-12-20 MED ORDER — NITROGLYCERIN 1 MG/10 ML FOR IR/CATH LAB
INTRA_ARTERIAL | Status: DC | PRN
  Administered 2017-12-20: 200 ug via INTRACORONARY

## 2017-12-20 MED ORDER — VERAPAMIL HCL 2.5 MG/ML IV SOLN
INTRAVENOUS | Status: AC
  Filled 2017-12-20: qty 2

## 2017-12-20 MED ORDER — IOPAMIDOL (ISOVUE-370) INJECTION 76%
INTRAVENOUS | Status: AC
  Filled 2017-12-20: qty 50

## 2017-12-20 MED ORDER — FENTANYL CITRATE (PF) 100 MCG/2ML IJ SOLN
INTRAMUSCULAR | Status: AC
  Filled 2017-12-20: qty 2

## 2017-12-20 MED ORDER — LIDOCAINE HCL (PF) 1 % IJ SOLN
INTRAMUSCULAR | Status: DC | PRN
  Administered 2017-12-20: 3 mL

## 2017-12-20 MED ORDER — ADENOSINE (DIAGNOSTIC) 140MCG/KG/MIN
INTRAVENOUS | Status: DC | PRN
  Administered 2017-12-20: 140 ug/kg/min via INTRAVENOUS

## 2017-12-20 MED ORDER — ISOSORBIDE MONONITRATE ER 30 MG PO TB24: 30.0000 mg | ORAL_TABLET | Freq: Every day | ORAL | 5 refills | Status: DC

## 2017-12-20 MED ORDER — IOPAMIDOL (ISOVUE-370) INJECTION 76%
INTRAVENOUS | Status: AC
  Filled 2017-12-20: qty 100

## 2017-12-20 MED ORDER — IOPAMIDOL (ISOVUE-370) INJECTION 76%
INTRAVENOUS | Status: DC | PRN
  Administered 2017-12-20: 115 mL via INTRA_ARTERIAL

## 2017-12-20 MED ORDER — HEPARIN (PORCINE) IN NACL 1000-0.9 UT/500ML-% IV SOLN
INTRAVENOUS | Status: DC | PRN
  Administered 2017-12-20 (×2): 500 mL

## 2017-12-20 MED ORDER — NITROGLYCERIN 1 MG/10 ML FOR IR/CATH LAB
INTRA_ARTERIAL | Status: AC
  Filled 2017-12-20: qty 10

## 2017-12-20 MED ORDER — SODIUM CHLORIDE 0.9 % WEIGHT BASED INFUSION
3.0000 mL/kg/h | INTRAVENOUS | Status: DC
  Administered 2017-12-20: 3 mL/kg/h via INTRAVENOUS

## 2017-12-20 MED ORDER — SODIUM CHLORIDE 0.9 % IV SOLN: INTRAVENOUS | Status: AC

## 2017-12-20 SURGICAL SUPPLY — 13 items
CATH 5FR JL3.5 JR4 ANG PIG MP (CATHETERS) ×2 IMPLANT
CATH LAUNCHER 6FR EBU3.5 (CATHETERS) ×2 IMPLANT
DEVICE RAD COMP TR BAND LRG (VASCULAR PRODUCTS) ×2 IMPLANT
GLIDESHEATH SLEND SS 6F .021 (SHEATH) ×2 IMPLANT
GUIDEWIRE INQWIRE 1.5J.035X260 (WIRE) ×1 IMPLANT
GUIDEWIRE PRESSURE COMET II (WIRE) ×2 IMPLANT
INQWIRE 1.5J .035X260CM (WIRE) ×2
KIT ESSENTIALS PG (KITS) ×2 IMPLANT
KIT HEART LEFT (KITS) ×2 IMPLANT
PACK CARDIAC CATHETERIZATION (CUSTOM PROCEDURE TRAY) ×2 IMPLANT
SYR MEDRAD MARK V 150ML (SYRINGE) ×2 IMPLANT
TRANSDUCER W/STOPCOCK (MISCELLANEOUS) ×2 IMPLANT
TUBING CIL FLEX 10 FLL-RA (TUBING) ×2 IMPLANT

## 2017-12-20 NOTE — Interval H&P Note (Signed)
History and Physical Interval Note:  12/20/2017 7:21 AM  Travis Tapia  has presented today for cardiac catheterization, with the diagnosis of accelerating angina and shortness of breath.  The various methods of treatment have been discussed with the patient and family. After consideration of risks, benefits and other options for treatment, the patient has consented to  Procedure(s): LEFT HEART CATH AND CORONARY ANGIOGRAPHY (N/A) as a surgical intervention .  The patient's history has been reviewed, patient examined, no change in status, stable for surgery.  I have reviewed the patient's chart and labs.  Questions were answered to the patient's satisfaction.    Cath Lab Visit (complete for each Cath Lab visit)  Clinical Evaluation Leading to the Procedure:   ACS: No.  Non-ACS:    Anginal Classification: CCS III  Anti-ischemic medical therapy: Minimal Therapy (1 class of medications)  Non-Invasive Test Results: No non-invasive testing performed  Prior CABG: No previous CABG  Travis Tapia

## 2017-12-20 NOTE — Discharge Instructions (Signed)
**Note Travis Tapia-identified via Obfuscation** Radial Site Care °Refer to this sheet in the next few weeks. These instructions provide you with information about caring for yourself after your procedure. Your health care provider may also give you more specific instructions. Your treatment has been planned according to current medical practices, but problems sometimes occur. Call your health care provider if you have any problems or questions after your procedure. °What can I expect after the procedure? °After your procedure, it is typical to have the following: °· Bruising at the radial site that usually fades within 1-2 weeks. °· Blood collecting in the tissue (hematoma) that may be painful to the touch. It should usually decrease in size and tenderness within 1-2 weeks. ° °Follow these instructions at home: °· Take medicines only as directed by your health care provider. °· You may shower 24-48 hours after the procedure or as directed by your health care provider. Remove the bandage (dressing) and gently wash the site with plain soap and water. Pat the area dry with a clean towel. Do not rub the site, because this may cause bleeding. °· Do not take baths, swim, or use a hot tub until your health care provider approves. °· Check your insertion site every day for redness, swelling, or drainage. °· Do not apply powder or lotion to the site. °· Do not flex or bend the affected arm for 24 hours or as directed by your health care provider. °· Do not push or pull heavy objects with the affected arm for 24 hours or as directed by your health care provider. °· Do not lift over 10 lb (4.5 kg) for 5 days after your procedure or as directed by your health care provider. °· Ask your health care provider when it is okay to: °? Return to work or school. °? Resume usual physical activities or sports. °? Resume sexual activity. °· Do not drive home if you are discharged the same day as the procedure. Have someone else drive you. °· You may drive 24 hours after the procedure  unless otherwise instructed by your health care provider. °· Do not operate machinery or power tools for 24 hours after the procedure. °· If your procedure was done as an outpatient procedure, which means that you went home the same day as your procedure, a responsible adult should be with you for the first 24 hours after you arrive home. °· Keep all follow-up visits as directed by your health care provider. This is important. °Contact a health care provider if: °· You have a fever. °· You have chills. °· You have increased bleeding from the radial site. Hold pressure on the site. °Get help right away if: °· You have unusual pain at the radial site. °· You have redness, warmth, or swelling at the radial site. °· You have drainage (other than a small amount of blood on the dressing) from the radial site. °· The radial site is bleeding, and the bleeding does not stop after 30 minutes of holding steady pressure on the site. °· Your arm or hand becomes pale, cool, tingly, or numb. °This information is not intended to replace advice given to you by your health care provider. Make sure you discuss any questions you have with your health care provider. °Document Released: 06/23/2010 Document Revised: 10/27/2015 Document Reviewed: 12/07/2013 °Elsevier Interactive Patient Education © 2018 [employer] ° °

## 2017-12-20 NOTE — Brief Op Note (Signed)
BRIEF CARDIAC CATHETERIZATION NOTE  DATE: 12/20/2017 TIME: 9:04 AM  PATIENT:  Travis Tapia  52 y.o. male  PRE-OPERATIVE DIAGNOSIS:  accelerating angina and shortness of breath  POST-OPERATIVE DIAGNOSIS:  Same  PROCEDURE:  Procedure(s): LEFT HEART CATH AND CORONARY ANGIOGRAPHY (N/A) INTRAVASCULAR PRESSURE WIRE/FFR STUDY (N/A)  SURGEON:  Surgeon(s) and Role:    * Travis Ewart, MD - Primary  FINDINGS: 1. Moderate, non-obstructive CAD.  FFR of LAD and LCx not significant. 2. Normal LVEF and LVEDP. 3. Transient vision changes noted with adenosine infusion without other focal neurologic deficits.  Symptoms resolved within 10 minutes of case completion.  RECOMMENDATIONS: 1. Medical therapy and secondary prevention.  Will add isosorbide mononitrate 30 mg daily. 2. If symptoms persist despite optimal medical therapy, function study to assess RCA disease should be considered. 3. Follow-up with Dr. Diona Tapia as an outpatient.  Travis Kendallhristopher Manreet Kiernan, MD [facility] Pager: [phone]

## 2018-01-22 NOTE — Progress Notes (Signed)
Cardiology Office Note  Date: 01/24/2018   ID: Travis LiasReuben K Hornbrook, DOB 01/24/1966, MRN [id number]  PCP: Majel HomerBuchanan, Linda, MD  Primary Cardiologist: Nona DellSamuel Mairyn Lenahan, MD   Chief Complaint  Patient presents with  . Coronary Artery Disease    History of Present Illness: Travis Tapia is a 52 y.o. male that I last saw in August 2018.  More recently interval visit noted with Ms. Jetta LoutDunn PA-C in July. He was scheduled at that time to undergo a follow-up diagnostic cardiac catheterization in light of shortness of breath and intermittent chest discomfort.  Procedure results are outlined below with recommendation for medical therapy per Dr. Okey DupreEnd.  He presents today for follow-up, states that he feels better, has tolerated Imdur well so far.  We discussed the results of his cardiac catheterization and plan to continue medical therapy at this time.  Still works full-time at the [employer] [place] in [place].  Current cardiac regimen includes aspirin, Plavix, atenolol, Lipitor, Imdur, and as needed nitroglycerin.  Past Medical History:  Diagnosis Date  . Arachnoid cyst    [facility]  . CAD (coronary artery disease)    a. inferoposterior STEMI 06/2014 s/p DES to RCA with staged DES to LAD at that time, EF 55-60% at time of MI. b. EF 51% by nonischemic nuc 01/2016.  Marland Kitchen. Depression   . History of inferior wall myocardial infarction    January 2016  . Hyperlipidemia   . Overweight   . Sinus bradycardia    a. not on BB due to this.    Past Surgical History:  Procedure Laterality Date  . APPENDECTOMY    . HERNIA REPAIR    . INTRAVASCULAR PRESSURE WIRE/FFR STUDY N/A 12/20/2017   Procedure: INTRAVASCULAR PRESSURE WIRE/FFR STUDY;  Surgeon: Yvonne KendallEnd, Christopher, MD;  Location: [facility] INV[facility]IVE [facility];  Service: Cardiovascular;  Laterality: N/A;  . LEFT HEART CATH AND CORONARY ANGIOGRAPHY N/A 12/20/2017   Procedure: LEFT HEART CATH AND CORONARY ANGIOGRAPHY;  Surgeon: Yvonne KendallEnd, Christopher, MD;   Location: [facility] INVASIVE CV [facility];  Service: Cardiovascular;  Laterality: N/A;    Current Outpatient Medications  Medication Sig Dispense Refill  . aspirin 81 MG tablet Take 81 mg by mouth daily.    Marland Kitchen. atenolol (TENORMIN) 25 MG tablet TAKE 1/2 TABLET BY MOUTH EVERY DAY 45 tablet 3  . Cholecalciferol ([employer] D3) 2000 UNITS CAPS Take 2,000 Units by mouth daily.     . clopidogrel (PLAVIX) 75 MG tablet TAKE 1 TABLET (75 MG TOTAL) BY MOUTH EVERY MORNING. 90 tablet 3  . isosorbide mononitrate (IMDUR) 30 MG 24 hr tablet Take 1 tablet (30 mg total) by mouth daily. 90 tablet 3  . nitroGLYCERIN (NITROSTAT) 0.4 MG SL tablet Place 1 tablet (0.4 mg total) under the tongue every 5 (five) minutes as needed for chest pain. 25 tablet 3  . ranitidine (ZANTAC) 150 MG tablet Take 150 mg by mouth 2 (two) times daily.    Marland Kitchen. SALINE NASAL MIST NA Place 1-2 sprays into the nose daily as needed (for congestion).    Marland Kitchen. atorvastatin (LIPITOR) 80 MG tablet Take 1 tablet (80 mg total) by mouth at bedtime. 90 tablet 3   No current facility-administered medications for this visit.    Allergies:  Ciprofloxacin   Social History: The patient  reports that he quit smoking about 11 years ago. His smokeless tobacco use includes chew. He reports that he drinks about 2.0 standard drinks of alcohol per week. He reports that he  does not use drugs.   ROS:  Please see the history of present illness. Otherwise, complete review of systems is positive for none.  All other systems are reviewed and negative.   Physical Exam: VS:  BP 128/82   Pulse 85   Ht 6\' 2"  (1.88 m)   Wt 231 lb (104.8 kg)   SpO2 98%   BMI 29.66 kg/m , BMI Body mass index is 29.66 kg/m.  Wt Readings from Last 3 Encounters:  01/24/18 231 lb (104.8 kg)  12/20/17 230 lb (104.3 kg)  12/18/17 229 lb (103.9 kg)    General: Patient appears comfortable at rest. HEENT: Conjunctiva and lids normal, oropharynx clear. Neck: Supple, no elevated JVP or carotid bruits, no  thyromegaly. Lungs: Clear to auscultation, nonlabored breathing at rest. Cardiac: Regular rate and rhythm, no S3 or significant systolic murmur. Abdomen: Soft, nontender, bowel sounds present. Extremities: No pitting edema, distal pulses 2+. Skin: Warm and dry. Musculoskeletal: No kyphosis. Neuropsychiatric: Alert and oriented x3, affect grossly appropriate.  ECG: I personally reviewed the tracing from 12/18/2017 which showed sinus rhythm with R' in lead V1.  Recent Labwork: 12/18/2017: ALT 70; AST 51; B Natriuretic Peptide 16.0; BUN 16; Creatinine, Ser 0.80; Hemoglobin 15.0; Platelets 258; Potassium 3.8; Sodium 142; TSH 1.505     Component Value Date/Time   CHOL 99 12/18/2017 1548   TRIG 127 12/18/2017 1548   HDL 30 (L) 12/18/2017 1548   CHOLHDL 3.3 12/18/2017 1548   VLDL 25 12/18/2017 1548   LDLCALC 44 12/18/2017 1548    Other Studies Reviewed Today:  Cardiac catheterization 12/20/2017: Conclusions: 1. Moderate multivessel coronary artery disease, as detailed below.  LAD and LCx stenoses are not significant by FFR.  FFR of RCA was not attempted due to chest pain and dyspnea as well as transient visual changes associated with adenosine administration.  Patient has experienced similar visual changes in the past and did not have any other focal neurologic deficits during or after today's procedure. 2. Widely patent stents in the proximal/mid LAD and proximal RCA. 3. Normal left ventricular systolic function and filling pressure.  Recommendations: 1. Escalation of medical therapy.  Will add isosorbide mononitrate 30 mg daily.  If patient has refractory chest pain, functional study to assess hemodynamic significance of RCA should be considered. 2. Aggressive secondary prevention.  Recommend dual antiplatelet therapy with Aspirin 81mg  daily and Clopidogrel 75mg  daily long-term (beyond 12 months) because of multivessel CAD and multivessel PCI at time of STEMI in 2016.   Assessment and  Plan:  1.  CAD status post DES to the RCA and LAD in 2017.  Recent cardiac catheterization showed patent stent sites with moderate residual mid RCA disease will be managed medically at this time.  He is tolerating Imdur, otherwise no changes to medical regimen.  2.  Mixed hyperlipidemia, on Lipitor.  Last LDL 44.  3.  Tobacco abuse in remission.  Current medicines were reviewed with the patient today.  Disposition: Follow-up in 6 months.  Signed, Jonelle SidleSamuel G. Hazelee Harbold, MD, [facility] 01/24/2018 10:02 AM    [facility] [address], [place], [place] [address] Phone: [phone]) [phone]; Fax: [phone]

## 2018-01-24 ENCOUNTER — Encounter: Payer: Self-pay | Admitting: Cardiology

## 2018-01-24 ENCOUNTER — Ambulatory Visit (INDEPENDENT_AMBULATORY_CARE_PROVIDER_SITE_OTHER): Payer: BLUE CROSS/BLUE SHIELD | Admitting: Cardiology

## 2018-01-24 VITALS — BP 128/82 | HR 85 | Ht 74.0 in | Wt 231.0 lb

## 2018-01-24 DIAGNOSIS — I25119 Atherosclerotic heart disease of native coronary artery with unspecified angina pectoris: Secondary | ICD-10-CM | POA: Diagnosis not present

## 2018-01-24 DIAGNOSIS — F17201 Nicotine dependence, unspecified, in remission: Secondary | ICD-10-CM

## 2018-01-24 DIAGNOSIS — E782 Mixed hyperlipidemia: Secondary | ICD-10-CM | POA: Diagnosis not present

## 2018-01-24 MED ORDER — ISOSORBIDE MONONITRATE ER 30 MG PO TB24: 30.0000 mg | ORAL_TABLET | Freq: Every day | ORAL | 3 refills | Status: AC

## 2018-01-24 MED ORDER — ATORVASTATIN CALCIUM 80 MG PO TABS: 80.0000 mg | ORAL_TABLET | Freq: Every day | ORAL | 3 refills | Status: DC

## 2018-01-24 NOTE — Patient Instructions (Addendum)

## 2018-02-06 ENCOUNTER — Telehealth: Payer: Self-pay | Admitting: Cardiology

## 2018-02-06 ENCOUNTER — Other Ambulatory Visit: Payer: Self-pay | Admitting: Cardiology

## 2018-02-06 NOTE — Telephone Encounter (Signed)
Patient stated that he needed note stating that he could wear respirator since having heart attack.  Stated that he had note with information needed on it.  Dr. Diona Browner only in [place] office this morning, then off.  Will be here in [place] tomorrow.  Also, informed patient of this.  Patient stated that they were going to take him out of work if he did not have this note.  Stated he would take chance on catching the doctor in [place] and will take note there.

## 2018-02-06 NOTE — Telephone Encounter (Signed)
Has questions about when he had heart attack due to company being bought out.

## 2018-02-11 ENCOUNTER — Telehealth: Payer: Self-pay | Admitting: *Deleted

## 2018-02-11 NOTE — Telephone Encounter (Signed)
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 Patient said that he did not pass the safety questions due to answering the question that he had a heart attack in the past. Patient said he just need a statement from his cardiologist stating that he has to wear a respirator while at work. Patient said that he is out of work because of this matter and is not getting paid and said he felt that he would get fired because of this. Patient informed that his letter has been sent for scanning and that he could have another one sent to our office and that I would contact our medical records department to see if the letter could get scanned into his chart sooner.

## 2018-02-11 NOTE — Telephone Encounter (Signed)
Scanning department contacted and said they have not received any documents from our office today and that it could take up to one week before the document is scanned into chart.

## 2018-02-11 NOTE — Telephone Encounter (Signed)
I need further clarification. Why did he "not pass the safety questions?" Does that mean that he is not allowed to wear a respiratory if he has cardiac disease or prior MI? If so, why is that the case? He had an MI in 2016 and presumably has worn a respirator since then. If need to know what specifically I am being asked to clear for him to do in this paperwork and to whom it needs to be sent. Understand that I am not back in the office until tomorrow.

## 2018-02-11 NOTE — Telephone Encounter (Signed)
-----   Message from Jonelle Sidle, MD sent at 02/11/2018  9:48 AM EDT ----- Regarding: RE: Respirator request No. I'm not aware of a cardiac reason however that he would need to wear a respirator (?)   ----- Message ----- From: Eustace Moore, LPN Sent: 7/71/1657   7:56 AM EDT To: Jonelle Sidle, MD Subject: Respirator request                             Good morning.  Did you see any paperwork in your basket on this patient Friday r/e him wearing a respirator at work due to his heart condition?

## 2018-02-11 NOTE — Telephone Encounter (Signed)
Patient contacted and informed that he would need to get a new copy of the letter sent to our office so that MD could review it upon his return to the office tomorrow. Offered to give fax and email address but patient said the didn't have anything to write with at this time and he would call back to get fax number or email address to have a new letter sent to our office with the requested information.

## 2018-02-12 ENCOUNTER — Encounter: Payer: Self-pay | Admitting: *Deleted

## 2018-02-12 NOTE — Telephone Encounter (Signed)
Spoke with Travis Tapia the safety inspector for patient's employer and he said that patient needs a letter stating he is medically cleared to wear a respirator without restrictions. Dr. Diona Browner notified and agrees to prepare a letter for patient.   Letter should be emailed to [email or web address] or patient can pick up letter.

## 2018-02-12 NOTE — Telephone Encounter (Signed)
Patient informed and said he would come by the office to pick letter up.

## 2018-11-16 ENCOUNTER — Other Ambulatory Visit: Payer: Self-pay | Admitting: Cardiology

## 2019-06-05 ENCOUNTER — Other Ambulatory Visit: Payer: Self-pay | Admitting: Cardiology

## 2019-07-05 ENCOUNTER — Other Ambulatory Visit: Payer: Self-pay | Admitting: Cardiology

## 2019-10-13 ENCOUNTER — Other Ambulatory Visit: Payer: Self-pay | Admitting: Cardiology

## 2019-12-12 ENCOUNTER — Other Ambulatory Visit: Payer: Self-pay | Admitting: Cardiology

## 2020-01-05 ENCOUNTER — Other Ambulatory Visit: Payer: Self-pay | Admitting: Cardiology

## 2023-09-03 DEATH — deceased
